# Patient Record
Sex: Female | Born: 1950 | Race: White | Hispanic: No | Marital: Married | Smoking: Never smoker
Health system: Southern US, Community
[De-identification: ages and names within clinical notes are randomized; demographics above are authoritative.]

## PROBLEM LIST (undated history)

## (undated) DIAGNOSIS — M8580 Other specified disorders of bone density and structure, unspecified site: Secondary | ICD-10-CM

## (undated) DIAGNOSIS — F32A Depression, unspecified: Secondary | ICD-10-CM

## (undated) DIAGNOSIS — F329 Major depressive disorder, single episode, unspecified: Secondary | ICD-10-CM

## (undated) DIAGNOSIS — R251 Tremor, unspecified: Secondary | ICD-10-CM

## (undated) DIAGNOSIS — I639 Cerebral infarction, unspecified: Secondary | ICD-10-CM

## (undated) DIAGNOSIS — E079 Disorder of thyroid, unspecified: Secondary | ICD-10-CM

## (undated) DIAGNOSIS — I10 Essential (primary) hypertension: Secondary | ICD-10-CM

## (undated) DIAGNOSIS — N289 Disorder of kidney and ureter, unspecified: Secondary | ICD-10-CM

## (undated) DIAGNOSIS — R911 Solitary pulmonary nodule: Secondary | ICD-10-CM

## (undated) DIAGNOSIS — E785 Hyperlipidemia, unspecified: Secondary | ICD-10-CM

## (undated) DIAGNOSIS — R609 Edema, unspecified: Secondary | ICD-10-CM

## (undated) DIAGNOSIS — N890 Mild vaginal dysplasia: Secondary | ICD-10-CM

## (undated) DIAGNOSIS — G459 Transient cerebral ischemic attack, unspecified: Secondary | ICD-10-CM

## (undated) DIAGNOSIS — T783XXA Angioneurotic edema, initial encounter: Secondary | ICD-10-CM

## (undated) DIAGNOSIS — G4730 Sleep apnea, unspecified: Secondary | ICD-10-CM

## (undated) HISTORY — DX: Tremor, unspecified: R25.1

## (undated) HISTORY — DX: Cerebral infarction, unspecified: I63.9

## (undated) HISTORY — DX: Hyperlipidemia, unspecified: E78.5

## (undated) HISTORY — DX: Transient cerebral ischemic attack, unspecified: G45.9

## (undated) HISTORY — DX: Angioneurotic edema, initial encounter: T78.3XXA

## (undated) HISTORY — DX: Essential (primary) hypertension: I10

## (undated) HISTORY — DX: Mild vaginal dysplasia: N89.0

## (undated) HISTORY — PX: HERNIA REPAIR: SHX51

## (undated) HISTORY — DX: Depression, unspecified: F32.A

## (undated) HISTORY — DX: Disorder of thyroid, unspecified: E07.9

## (undated) HISTORY — PX: OTHER SURGICAL HISTORY: SHX169

## (undated) HISTORY — DX: Major depressive disorder, single episode, unspecified: F32.9

## (undated) HISTORY — DX: Sleep apnea, unspecified: G47.30

## (undated) HISTORY — DX: Edema, unspecified: R60.9

## (undated) HISTORY — DX: Other specified disorders of bone density and structure, unspecified site: M85.80

## (undated) HISTORY — DX: Solitary pulmonary nodule: R91.1

## (undated) HISTORY — DX: Disorder of kidney and ureter, unspecified: N28.9

## (undated) HISTORY — PX: BROW LIFT: SHX178

---

## 1998-10-14 ENCOUNTER — Other Ambulatory Visit: Admission: RE | Admit: 1998-10-14 | Discharge: 1998-10-14 | Payer: Self-pay | Admitting: Obstetrics and Gynecology

## 1998-11-04 ENCOUNTER — Encounter: Payer: Self-pay | Admitting: Obstetrics and Gynecology

## 1998-11-07 HISTORY — PX: ABDOMINAL HYSTERECTOMY: SHX81

## 1998-11-09 ENCOUNTER — Inpatient Hospital Stay (HOSPITAL_COMMUNITY): Admission: RE | Admit: 1998-11-09 | Discharge: 1998-11-12 | Payer: Self-pay | Admitting: Obstetrics and Gynecology

## 1999-11-09 ENCOUNTER — Other Ambulatory Visit: Admission: RE | Admit: 1999-11-09 | Discharge: 1999-11-09 | Payer: Self-pay | Admitting: Obstetrics and Gynecology

## 2000-08-09 ENCOUNTER — Encounter: Payer: Self-pay | Admitting: *Deleted

## 2000-08-09 ENCOUNTER — Inpatient Hospital Stay (HOSPITAL_COMMUNITY): Admission: EM | Admit: 2000-08-09 | Discharge: 2000-08-10 | Payer: Self-pay | Admitting: Emergency Medicine

## 2000-09-08 ENCOUNTER — Ambulatory Visit (HOSPITAL_COMMUNITY): Admission: RE | Admit: 2000-09-08 | Discharge: 2000-09-08 | Payer: Self-pay | Admitting: Neurology

## 2000-09-11 ENCOUNTER — Encounter: Admission: RE | Admit: 2000-09-11 | Discharge: 2000-12-10 | Payer: Self-pay | Admitting: Neurology

## 2001-06-15 ENCOUNTER — Other Ambulatory Visit: Admission: RE | Admit: 2001-06-15 | Discharge: 2001-06-15 | Payer: Self-pay | Admitting: Obstetrics and Gynecology

## 2001-10-09 ENCOUNTER — Encounter: Payer: Self-pay | Admitting: Emergency Medicine

## 2001-10-09 ENCOUNTER — Emergency Department (HOSPITAL_COMMUNITY): Admission: EM | Admit: 2001-10-09 | Discharge: 2001-10-09 | Payer: Self-pay

## 2002-04-12 ENCOUNTER — Ambulatory Visit (HOSPITAL_COMMUNITY): Admission: RE | Admit: 2002-04-12 | Discharge: 2002-04-12 | Payer: Self-pay | Admitting: Interventional Cardiology

## 2002-04-12 ENCOUNTER — Encounter: Payer: Self-pay | Admitting: Interventional Cardiology

## 2002-06-28 ENCOUNTER — Other Ambulatory Visit: Admission: RE | Admit: 2002-06-28 | Discharge: 2002-06-28 | Payer: Self-pay | Admitting: Obstetrics and Gynecology

## 2002-09-27 ENCOUNTER — Ambulatory Visit (HOSPITAL_COMMUNITY): Admission: RE | Admit: 2002-09-27 | Discharge: 2002-09-27 | Payer: Self-pay | Admitting: Gastroenterology

## 2003-01-14 ENCOUNTER — Encounter: Payer: Self-pay | Admitting: Pulmonary Disease

## 2003-01-14 ENCOUNTER — Ambulatory Visit (HOSPITAL_COMMUNITY): Admission: RE | Admit: 2003-01-14 | Discharge: 2003-01-15 | Payer: Self-pay | Admitting: Pulmonary Disease

## 2003-05-07 ENCOUNTER — Inpatient Hospital Stay (HOSPITAL_COMMUNITY): Admission: RE | Admit: 2003-05-07 | Discharge: 2003-05-12 | Payer: Self-pay | Admitting: Orthopedic Surgery

## 2003-05-11 ENCOUNTER — Encounter: Payer: Self-pay | Admitting: Orthopedic Surgery

## 2003-06-25 ENCOUNTER — Ambulatory Visit (HOSPITAL_COMMUNITY): Admission: RE | Admit: 2003-06-25 | Discharge: 2003-06-25 | Payer: Self-pay | Admitting: Orthopedic Surgery

## 2003-07-11 ENCOUNTER — Other Ambulatory Visit: Admission: RE | Admit: 2003-07-11 | Discharge: 2003-07-11 | Payer: Self-pay | Admitting: Obstetrics and Gynecology

## 2003-08-08 ENCOUNTER — Ambulatory Visit (HOSPITAL_BASED_OUTPATIENT_CLINIC_OR_DEPARTMENT_OTHER): Admission: RE | Admit: 2003-08-08 | Discharge: 2003-08-08 | Payer: Self-pay | Admitting: Neurology

## 2003-12-31 ENCOUNTER — Emergency Department (HOSPITAL_COMMUNITY): Admission: EM | Admit: 2003-12-31 | Discharge: 2003-12-31 | Payer: Self-pay | Admitting: Family Medicine

## 2004-02-26 ENCOUNTER — Ambulatory Visit (HOSPITAL_COMMUNITY): Admission: RE | Admit: 2004-02-26 | Discharge: 2004-02-26 | Payer: Self-pay | Admitting: Cardiology

## 2004-09-28 ENCOUNTER — Ambulatory Visit: Payer: Self-pay | Admitting: Cardiology

## 2005-03-08 ENCOUNTER — Ambulatory Visit (HOSPITAL_COMMUNITY): Admission: RE | Admit: 2005-03-08 | Discharge: 2005-03-08 | Payer: Self-pay

## 2005-05-02 ENCOUNTER — Ambulatory Visit: Payer: Self-pay | Admitting: Cardiology

## 2005-12-26 ENCOUNTER — Ambulatory Visit (HOSPITAL_COMMUNITY): Admission: RE | Admit: 2005-12-26 | Discharge: 2005-12-26 | Payer: Self-pay | Admitting: Internal Medicine

## 2006-01-05 ENCOUNTER — Ambulatory Visit (HOSPITAL_COMMUNITY): Admission: RE | Admit: 2006-01-05 | Discharge: 2006-01-05 | Payer: Self-pay | Admitting: Internal Medicine

## 2006-06-22 ENCOUNTER — Ambulatory Visit: Payer: Self-pay | Admitting: Cardiology

## 2006-08-04 ENCOUNTER — Ambulatory Visit (HOSPITAL_COMMUNITY): Admission: RE | Admit: 2006-08-04 | Discharge: 2006-08-04 | Payer: Self-pay | Admitting: Internal Medicine

## 2006-12-13 ENCOUNTER — Other Ambulatory Visit: Admission: RE | Admit: 2006-12-13 | Discharge: 2006-12-13 | Payer: Self-pay | Admitting: Gynecology

## 2007-02-07 ENCOUNTER — Ambulatory Visit (HOSPITAL_COMMUNITY): Admission: RE | Admit: 2007-02-07 | Discharge: 2007-02-07 | Payer: Self-pay | Admitting: Internal Medicine

## 2007-04-08 ENCOUNTER — Emergency Department (HOSPITAL_COMMUNITY): Admission: EM | Admit: 2007-04-08 | Discharge: 2007-04-08 | Payer: Self-pay | Admitting: Emergency Medicine

## 2007-08-10 ENCOUNTER — Other Ambulatory Visit: Admission: RE | Admit: 2007-08-10 | Discharge: 2007-08-10 | Payer: Self-pay | Admitting: Gynecology

## 2007-08-28 ENCOUNTER — Ambulatory Visit: Payer: Self-pay | Admitting: Cardiology

## 2007-11-08 DIAGNOSIS — N890 Mild vaginal dysplasia: Secondary | ICD-10-CM

## 2007-11-08 HISTORY — DX: Mild vaginal dysplasia: N89.0

## 2007-11-21 ENCOUNTER — Encounter: Payer: Self-pay | Admitting: Internal Medicine

## 2007-12-04 ENCOUNTER — Ambulatory Visit: Payer: Self-pay | Admitting: Internal Medicine

## 2007-12-04 DIAGNOSIS — R059 Cough, unspecified: Secondary | ICD-10-CM | POA: Insufficient documentation

## 2007-12-04 DIAGNOSIS — J984 Other disorders of lung: Secondary | ICD-10-CM | POA: Insufficient documentation

## 2007-12-04 DIAGNOSIS — R05 Cough: Secondary | ICD-10-CM

## 2007-12-08 ENCOUNTER — Encounter: Payer: Self-pay | Admitting: Cardiology

## 2007-12-08 DIAGNOSIS — F32A Depression, unspecified: Secondary | ICD-10-CM | POA: Insufficient documentation

## 2007-12-08 DIAGNOSIS — F329 Major depressive disorder, single episode, unspecified: Secondary | ICD-10-CM

## 2007-12-08 DIAGNOSIS — I10 Essential (primary) hypertension: Secondary | ICD-10-CM | POA: Insufficient documentation

## 2007-12-08 DIAGNOSIS — G4730 Sleep apnea, unspecified: Secondary | ICD-10-CM | POA: Insufficient documentation

## 2007-12-08 DIAGNOSIS — F419 Anxiety disorder, unspecified: Secondary | ICD-10-CM | POA: Insufficient documentation

## 2007-12-20 ENCOUNTER — Other Ambulatory Visit: Admission: RE | Admit: 2007-12-20 | Discharge: 2007-12-20 | Payer: Self-pay | Admitting: Obstetrics and Gynecology

## 2007-12-25 ENCOUNTER — Ambulatory Visit: Payer: Self-pay | Admitting: Internal Medicine

## 2007-12-25 DIAGNOSIS — R0602 Shortness of breath: Secondary | ICD-10-CM | POA: Insufficient documentation

## 2008-01-30 ENCOUNTER — Ambulatory Visit: Payer: Self-pay | Admitting: Cardiology

## 2008-03-04 ENCOUNTER — Encounter (HOSPITAL_COMMUNITY): Admission: RE | Admit: 2008-03-04 | Discharge: 2008-03-07 | Payer: Self-pay | Admitting: Internal Medicine

## 2008-08-10 ENCOUNTER — Emergency Department (HOSPITAL_COMMUNITY): Admission: EM | Admit: 2008-08-10 | Discharge: 2008-08-10 | Payer: Self-pay | Admitting: Emergency Medicine

## 2008-08-11 ENCOUNTER — Ambulatory Visit: Payer: Self-pay | Admitting: Cardiology

## 2008-08-12 ENCOUNTER — Inpatient Hospital Stay (HOSPITAL_COMMUNITY): Admission: EM | Admit: 2008-08-12 | Discharge: 2008-08-18 | Payer: Self-pay | Admitting: Emergency Medicine

## 2008-08-12 ENCOUNTER — Ambulatory Visit: Payer: Self-pay | Admitting: Cardiology

## 2008-08-13 ENCOUNTER — Encounter (INDEPENDENT_AMBULATORY_CARE_PROVIDER_SITE_OTHER): Payer: Self-pay | Admitting: Internal Medicine

## 2008-08-13 ENCOUNTER — Ambulatory Visit: Payer: Self-pay | Admitting: Vascular Surgery

## 2008-09-17 ENCOUNTER — Encounter: Admission: RE | Admit: 2008-09-17 | Discharge: 2008-11-06 | Payer: Self-pay | Admitting: Nurse Practitioner

## 2008-11-12 ENCOUNTER — Ambulatory Visit: Payer: Self-pay | Admitting: Cardiology

## 2008-11-12 DIAGNOSIS — E7800 Pure hypercholesterolemia, unspecified: Secondary | ICD-10-CM | POA: Insufficient documentation

## 2008-11-12 DIAGNOSIS — R259 Unspecified abnormal involuntary movements: Secondary | ICD-10-CM | POA: Insufficient documentation

## 2008-11-20 ENCOUNTER — Emergency Department (HOSPITAL_COMMUNITY): Admission: EM | Admit: 2008-11-20 | Discharge: 2008-11-20 | Payer: Self-pay | Admitting: Emergency Medicine

## 2009-02-16 ENCOUNTER — Ambulatory Visit: Payer: Self-pay | Admitting: Women's Health

## 2009-02-16 ENCOUNTER — Encounter: Payer: Self-pay | Admitting: Women's Health

## 2009-02-16 ENCOUNTER — Other Ambulatory Visit: Admission: RE | Admit: 2009-02-16 | Discharge: 2009-02-16 | Payer: Self-pay | Admitting: Gynecology

## 2009-03-10 ENCOUNTER — Inpatient Hospital Stay (HOSPITAL_COMMUNITY): Admission: EM | Admit: 2009-03-10 | Discharge: 2009-03-14 | Payer: Self-pay | Admitting: Emergency Medicine

## 2009-03-11 ENCOUNTER — Encounter: Payer: Self-pay | Admitting: Cardiology

## 2009-03-27 ENCOUNTER — Emergency Department (HOSPITAL_COMMUNITY): Admission: EM | Admit: 2009-03-27 | Discharge: 2009-03-27 | Payer: Self-pay | Admitting: Emergency Medicine

## 2009-04-22 ENCOUNTER — Encounter: Payer: Self-pay | Admitting: Cardiology

## 2009-04-22 ENCOUNTER — Ambulatory Visit: Payer: Self-pay | Admitting: Cardiology

## 2009-04-29 ENCOUNTER — Encounter (INDEPENDENT_AMBULATORY_CARE_PROVIDER_SITE_OTHER): Payer: Self-pay | Admitting: *Deleted

## 2009-06-11 ENCOUNTER — Telehealth: Payer: Self-pay | Admitting: Cardiology

## 2009-07-08 ENCOUNTER — Encounter: Payer: Self-pay | Admitting: Cardiology

## 2009-07-08 ENCOUNTER — Ambulatory Visit: Payer: Self-pay | Admitting: Cardiology

## 2009-07-31 ENCOUNTER — Encounter: Payer: Self-pay | Admitting: Cardiology

## 2009-09-17 ENCOUNTER — Telehealth: Payer: Self-pay | Admitting: Cardiology

## 2009-09-21 ENCOUNTER — Encounter: Payer: Self-pay | Admitting: Cardiology

## 2009-10-25 ENCOUNTER — Emergency Department (HOSPITAL_COMMUNITY): Admission: EM | Admit: 2009-10-25 | Discharge: 2009-10-25 | Payer: Self-pay | Admitting: Emergency Medicine

## 2009-10-27 ENCOUNTER — Encounter: Payer: Self-pay | Admitting: Cardiology

## 2009-11-04 ENCOUNTER — Encounter: Payer: Self-pay | Admitting: Cardiology

## 2009-11-09 ENCOUNTER — Telehealth: Payer: Self-pay | Admitting: Cardiology

## 2009-11-10 ENCOUNTER — Encounter: Payer: Self-pay | Admitting: Cardiology

## 2009-11-25 ENCOUNTER — Encounter: Payer: Self-pay | Admitting: Cardiology

## 2009-12-01 ENCOUNTER — Telehealth: Payer: Self-pay | Admitting: Cardiology

## 2009-12-18 ENCOUNTER — Encounter: Payer: Self-pay | Admitting: Cardiology

## 2009-12-25 ENCOUNTER — Encounter: Payer: Self-pay | Admitting: Cardiology

## 2009-12-29 ENCOUNTER — Encounter: Payer: Self-pay | Admitting: Cardiology

## 2010-01-06 ENCOUNTER — Ambulatory Visit: Payer: Self-pay | Admitting: Cardiology

## 2010-01-06 ENCOUNTER — Encounter: Payer: Self-pay | Admitting: Cardiology

## 2010-01-24 ENCOUNTER — Encounter: Payer: Self-pay | Admitting: Cardiology

## 2010-01-25 ENCOUNTER — Encounter: Payer: Self-pay | Admitting: Cardiology

## 2010-01-31 ENCOUNTER — Encounter: Payer: Self-pay | Admitting: Cardiology

## 2010-02-01 ENCOUNTER — Encounter: Payer: Self-pay | Admitting: Cardiology

## 2010-02-05 ENCOUNTER — Encounter: Payer: Self-pay | Admitting: Cardiology

## 2010-02-11 ENCOUNTER — Ambulatory Visit: Payer: Self-pay | Admitting: Cardiology

## 2010-02-18 ENCOUNTER — Other Ambulatory Visit: Admission: RE | Admit: 2010-02-18 | Discharge: 2010-02-18 | Payer: Self-pay | Admitting: Obstetrics and Gynecology

## 2010-02-18 ENCOUNTER — Ambulatory Visit: Payer: Self-pay | Admitting: Women's Health

## 2010-02-23 ENCOUNTER — Encounter: Admission: RE | Admit: 2010-02-23 | Discharge: 2010-02-23 | Payer: Self-pay | Admitting: Internal Medicine

## 2010-02-23 ENCOUNTER — Encounter: Payer: Self-pay | Admitting: Cardiology

## 2010-02-24 ENCOUNTER — Encounter: Payer: Self-pay | Admitting: Cardiology

## 2010-03-03 ENCOUNTER — Telehealth: Payer: Self-pay | Admitting: Cardiology

## 2010-06-02 ENCOUNTER — Encounter: Payer: Self-pay | Admitting: Cardiology

## 2010-06-02 ENCOUNTER — Ambulatory Visit: Payer: Self-pay | Admitting: Cardiology

## 2010-06-02 ENCOUNTER — Telehealth (INDEPENDENT_AMBULATORY_CARE_PROVIDER_SITE_OTHER): Payer: Self-pay | Admitting: *Deleted

## 2010-06-07 DIAGNOSIS — E876 Hypokalemia: Secondary | ICD-10-CM | POA: Insufficient documentation

## 2010-06-10 ENCOUNTER — Ambulatory Visit: Payer: Self-pay | Admitting: Internal Medicine

## 2010-06-10 ENCOUNTER — Ambulatory Visit (HOSPITAL_COMMUNITY): Admission: RE | Admit: 2010-06-10 | Discharge: 2010-06-10 | Payer: Self-pay | Admitting: Cardiology

## 2010-06-10 ENCOUNTER — Ambulatory Visit: Payer: Self-pay

## 2010-06-10 ENCOUNTER — Encounter: Payer: Self-pay | Admitting: Cardiology

## 2010-08-30 ENCOUNTER — Encounter: Admission: RE | Admit: 2010-08-30 | Discharge: 2010-08-30 | Payer: Self-pay | Admitting: Neurosurgery

## 2010-11-07 HISTORY — PX: WRIST FRACTURE SURGERY: SHX121

## 2010-11-28 ENCOUNTER — Encounter: Payer: Self-pay | Admitting: Cardiology

## 2010-11-28 ENCOUNTER — Encounter: Payer: Self-pay | Admitting: Internal Medicine

## 2010-12-07 NOTE — Progress Notes (Signed)
Summary: REFILL  Phone Note Refill Request   Refills Requested: Medication #1:  DIOVAN HCT 320-25 MG TABS ONE TABLET BY MOUTH ONCE DAILY   Supply Requested: [facility] months [facility] [address] [id number]   Method Requested: Fax to Local Pharmacy Initial call taken by: Migdalia Dk,  November 09, 2009 2:17 PM    Prescriptions: DIOVAN HCT 320-25 MG TABS (VALSARTAN-HYDROCHLOROTHIAZIDE) ONE TABLET BY MOUTH ONCE DAILY  #30 x 12   Entered by:   Kem Parkinson   Authorized by:   Ferman Hamming, MD, [facility]   Signed by:   Kem Parkinson on 11/09/2009   Method used:   [facility]        [facility] [phone][facility] (retail)       [address] [place], [place]  [address]       Ph: [phone] or [phone]       Fax: [phone]   RxID:   [id number]

## 2010-12-07 NOTE — Assessment & Plan Note (Signed)
Summary: per pt call she has been having trouble with b/p   Referring Provider:  Marisue Brooklyn Primary Provider:  Marisue Brooklyn  CC:  sob and and increasing BP.  History of Present Illness: Ms. Knarr is a pleasant female who I have seen for severe hypertension. Patient had a Myoview performed in October of 2005 that showed an ejection fraction of 75% and no ischemia. Echocardiogram in October, 2009 revealed normal LV function. She had elevated catecholamines previously but a negative MIBG scan for pheochromocytoma. She did have an MRA 03-11-09 that showed no renal artery stenosis. A chest CT 03-11-09  showed no dissection or pulmonary embolus but there was a small nodule and followup was recommended in one year.  Note previous TSH was normal. I last saw her in March of 2011. Since then she does have some dyspnea on exertion relieved with rest. It is not associated with chest pain and is more chronic. There is no orthopnea or PND but there is mild pedal edema. She does not have exertional chest pain. She has had problems with elevated blood pressure since I saw her previously. Her medications have been adjusted with the addition of Benicar and minoxidil. She states that her blood pressure still runs in the 170/100 range in the afternoons. Also note that her labetalol was decreased secondary to some fatigue.  Current Medications (verified): 1)  Alprazolam 0.5 Mg  Tabs (Alprazolam) .... As Needed 2)  Ambien Cr 12.5 Mg  Tbcr (Zolpidem Tartrate) .... As Needed 3)  Symbicort 160-4.5 Mcg/act Aero (Budesonide-Formoterol Fumarate) .... As Needed 4)  Protonix 40 Mg Tbec (Pantoprazole Sodium) .... Take 1 Tablet By Mouth Two Times A Day 5)  Plavix 75 Mg Tabs (Clopidogrel Bisulfate) .... Take One Tablet By Mouth Daily 6)  Simvastatin 40 Mg Tabs (Simvastatin) .... Take One Tablet By Mouth Daily At Bedtime 7)  Labetalol Hcl 100 Mg Tabs (Labetalol Hcl) .Marland Kitchen.. 1 Tab Morning and 2 Tabs Qhs 8)  Aspirin 81 Mg Tbec  (Aspirin) .... Take One Tablet By Mouth Daily 9)  Furosemide 20 Mg Tabs (Furosemide) .Marland Kitchen.. 1 Tan Every Other Day 10)  Clonidine Hcl 0.2 Mg Tabs (Clonidine Hcl) .... Take One Tablet By Two Times A Day 11)  Wellbutrin Xl 150 Mg Xr24h-Tab (Bupropion Hcl) .... Take 1 Tablet By Mouth Two Times A Day 12)  Qvar 40 Mcg/act Aers (Beclomethasone Dipropionate) .... 2 Puffs Two Times A Day 13)  Benicar 20 Mg Tabs (Olmesartan Medoxomil) .... Take One Tablet By Mouth Daily 14)  Minoxidil 10 Mg Tabs (Minoxidil) .... 1/2 Tab By Mouth Once Daily 15)  Study Drug Insulin Resistant .... As Directed  Allergies: 1)  ! Demerol 2)  ! Ace Inhibitors  Past History:  Past Medical History: Reviewed history from 01/06/2010 and no changes required. SLEEP APNEA (ICD-780.57) HYPERTENSION (ICD-401.9) DEPRESSION (ICD-311) PULMONARY NODULE (ICD-518.89) CVA  3. Tremor, of unclear etiology.   4. Hx of sleep apnea. x has cpap machine x non compliant due to "panic". Managed by Dr. Jobe Marker in [place], [place] 4. Pheo workup and thyroid workup negative in past NOS  5. Post viral reactive cough periodically since 2002 6. s/p "mini stroke" Hyperlipidemia Mild renal insufficiency  Past Surgical History: Reviewed history from 01/06/2010 and no changes required. Left knee replacement.  Hysterectomy.  Stamey procedure. .  Status post lumpectomy.  Bilateral tubal ligation.   Social History: Reviewed history from 11/12/2008 and no changes required. The patient is a nonsmoker. She is not involved in  an exercise program and she is post menopausal. 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 Passive smoker 1st 20 years of life.  Married  Alcohol Use - yes (rare)  Review of Systems       Problems with bilateral lower extremity pain, occasional headaches but no fevers or chills, productive cough, hemoptysis, dysphasia, odynophagia, melena, hematochezia, dysuria, hematuria, rash, seizure activity, orthopnea, PND,   claudication. Remaining systems are negative.   Vital Signs:  Patient profile:   60 year old female Height:      62 inches Weight:      196 pounds BMI:     35.98 Pulse rate:   77 / minute Resp:     14 per minute BP sitting:   141 / 81  (left arm)  Vitals Entered By: Kem Parkinson (February 11, 2010 4:37 PM)  Physical Exam  General:  Well-developed well-nourished in no acute distress.  Skin is warm and dry.  HEENT is normal.  Neck is supple. No thyromegaly.  Chest is clear to auscultation with normal expansion.  Cardiovascular exam is regular rate and rhythm.  Abdominal exam nontender or distended. No masses palpated. Extremities show trace edema. neuro grossly intact; resting tremor    Impression & Recommendations:  Problem # 1:  HYPERTENSION (ICD-401.9) Pt's blood pressure remains high in the evenings by her report. I will increase her Benicar to 40 mg p.o. daily. Note minoxidil was also recently added and she has developed increasing pedal edema. I will increase her Lasix to 20 mg p.o. daily. We will check a bmet in one week. Her updated medication list for this problem includes:    Labetalol Hcl 100 Mg Tabs (Labetalol hcl) .Marland Kitchen... 1 tab morning and 2 tabs qhs    Aspirin 81 Mg Tbec (Aspirin) .Marland Kitchen... Take one tablet by mouth daily    Furosemide 20 Mg Tabs (Furosemide) .Marland Kitchen... Take one tablet by mouth daily    Clonidine Hcl 0.2 Mg Tabs (Clonidine hcl) .Marland Kitchen... Take one tablet by two times a day    Benicar 40 Mg Tabs (Olmesartan medoxomil) .Marland Kitchen... Take one tablet by mouth daily    Minoxidil 10 Mg Tabs (Minoxidil) .Marland Kitchen... 1/2 tab by mouth once daily  Problem # 2:  HYPERCHOLESTEROLEMIA-PURE (ICD-272.0) Continue statin. Management per primary care. Her updated medication list for this problem includes:    Simvastatin 40 Mg Tabs (Simvastatin) .Marland Kitchen... Take one tablet by mouth daily at bedtime  Problem # 3:  SLEEP APNEA (ICD-780.57)  Problem # 4:  PULMONARY NODULE (ZOX-096.04) Patient  scheduled for followup chest CT in May.  Patient Instructions: 1)  Your physician recommends that you schedule a follow-up appointment in: 3 months. 2)  Your physician recommends that you return for lab work in: 1 week BMET. 3)  Your physician has recommended you make the following change in your medication: Furosemide 20 mg take one tablet by mouth daily , Benicar 40 mg one tablet daily. Prescriptions: BENICAR 40 MG TABS (OLMESARTAN MEDOXOMIL) Take one tablet by mouth daily  #30 x 6   Entered by:   Ollen Gross, RN, BSN   Authorized by:   Ferman Hamming, MD, [facility]   Signed by:   Ollen Gross, RN, BSN on 02/11/2010   Method used:   Electronically to        [facility] [phone]* (retail)       [address] [place], [place]  [address]  Ph: [phone] or [phone]       Fax: [phone]   RxID:   [id number] FUROSEMIDE 20 MG TABS (FUROSEMIDE) take one tablet by mouth daily  #30 x 6   Entered by:   Ollen Gross, RN, BSN   Authorized by:   Ferman Hamming, MD, [facility]   Signed by:   Ollen Gross, RN, BSN on 02/11/2010   Method used:   Electronically to        [facility] [phone]* (retail)       [address] [place], [place]  [address]       Ph: [phone] or [phone]       Fax: [phone]   RxID:   [id number]

## 2010-12-07 NOTE — Letter (Signed)
[facility]   [facility] Office Note   Imported By: Roderic Ovens 04/27/2010 15:42:38  _____________________________________________________________________  External Attachment:    Type:   Image     Comment:   External Document

## 2010-12-07 NOTE — Progress Notes (Signed)
Summary: [facility] Office Note   [facility] Office Note   Imported By: Roderic Ovens 04/27/2010 15:26:58  _____________________________________________________________________  External Attachment:    Type:   Image     Comment:   External Document

## 2010-12-07 NOTE — Progress Notes (Signed)
Summary: [facility] Office Note   [facility] Office Note   Imported By: Roderic Ovens 04/27/2010 15:27:21  _____________________________________________________________________  External Attachment:    Type:   Image     Comment:   External Document

## 2010-12-07 NOTE — Assessment & Plan Note (Signed)
Summary: [facility]   Visit Type:  6 months follow up Referring Provider:  Marisue Brooklyn Primary Provider:  Marisue Brooklyn  CC:  Face edema and sometimes on legs.  History of Present Illness: [name] [name] is a pleasant female who I have seen for severe hypertension. Patient had a Myoview performed in October of 2005 that showed an ejection fraction of 75% and no ischemia. Echocardiogram in October, 2009 revealed normal LV function. She had elevated catecholamines previously but a negative MIBG scan for pheochromocytoma. She did have an MRA 03-11-09 that showed no renal artery stenosis. A chest CT 03-11-09  showed no dissection or pulmonary embolus but there was a small nodule and followup was recommended in one year.  Note previous TSH was normal. I last saw her in Sept of 2010. Since then she does have some dyspnea on exertion relieved with rest. There is no associated chest pain. This is a chronic issue. There is no orthopnea, PND or pedal edema. She has had several episodes of a rash around her neck, face and some tongue swelling. This has been felt to be an allergic reaction and possibly angioedema. Her Diovan was discontinued but she had an episode subsequently and has been on steroids. She has not had exertional chest pain.  Current Medications (verified): 1)  Alprazolam 0.5 Mg  Tabs (Alprazolam) .... As Needed 2)  Ambien Cr 12.5 Mg  Tbcr (Zolpidem Tartrate) .... As Needed 3)  Albuterol 90 Mcg/act  Aers (Albuterol) .... As Needed 4)  Protonix 40 Mg Tbec (Pantoprazole Sodium) .... Take 1 Tablet By Mouth Two Times A Day 5)  Plavix 75 Mg Tabs (Clopidogrel Bisulfate) .... Take One Tablet By Mouth Daily 6)  Levsin 0.125 Mg Tabs (Hyoscyamine Sulfate) .... As Needed 7)  Simvastatin 40 Mg Tabs (Simvastatin) .... Take One Tablet By Mouth Daily At Bedtime 8)  Soma 250 Mg Tabs (Carisoprodol) .... As Needed 9)  Labetalol Hcl 200 Mg Tabs (Labetalol Hcl) .... Take 1 Tablet By Mouth Two Times A Day 10)   Aspirin 81 Mg Tbec (Aspirin) .... Take One Tablet By Mouth Daily 11)  Lasix 40 Mg Tabs (Furosemide) .... As Needed 12)  Ultram 50 Mg Tabs (Tramadol Hcl) .... As Needed 13)  Clonidine Hcl 0.2 Mg Tabs (Clonidine Hcl) .... Take One Tablet By Mouth Three Times A Day 14)  Wellbutrin Xl 150 Mg Xr24h-Tab (Bupropion Hcl) .... Take 1 Tablet By Mouth Once A Day 15)  Qvar 40 Mcg/act Aers (Beclomethasone Dipropionate) .... 2 Puffs Two Times A Day  Allergies: 1)  ! Demerol 2)  ! Ace Inhibitors  Past History:  Past Medical History: SLEEP APNEA (ICD-780.57) HYPERTENSION (ICD-401.9) DEPRESSION (ICD-311) PULMONARY NODULE (ICD-518.89) CVA  3. Tremor, of unclear etiology.   4. Hx of sleep apnea. x has cpap machine x non compliant due to "panic". Managed by Dr. Jobe Marker in [place], [place] 4. Pheo workup and thyroid workup negative in past NOS  5. Post viral reactive cough periodically since 2002 6. s/p "mini stroke" Hyperlipidemia Mild renal insufficiency  Past Surgical History: Left knee replacement.  Hysterectomy.  Stamey procedure. .  Status post lumpectomy.  Bilateral tubal ligation.   Social History: Reviewed history from 11/12/2008 and no changes required. The patient is a nonsmoker. She is not involved in an exercise program and she is post menopausal. 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 Passive smoker 1st 20 years of life.  Married  Alcohol Use - yes (rare)  Review  of Systems       She has had a productive cough but no fevers or chills, hemoptysis, dysphasia, odynophagia, melena, hematochezia, dysuria, hematuria, rash, seizure activity, orthopnea, PND, pedal edema, claudication. Remaining systems are negative.   Vital Signs:  Patient profile:   60 year old female Height:      62 inches Weight:      192.75 pounds BMI:     35.38 Pulse rate:   60 / minute Pulse rhythm:   regular Resp:     18 per minute BP sitting:   142 / 84  (left arm) Cuff size:    large  Vitals Entered By: Vikki Ports (January 06, 2010 2:09 PM)  Physical Exam  General:  Well-developed well-nourished in no acute distress.  Skin is warm and dry. Mild erythema around her neck and mouth. HEENT is normal.  Neck is supple. No thyromegaly.  Chest is clear to auscultation with normal expansion.  Cardiovascular exam is regular rate and rhythm.  Abdominal exam nontender or distended. No masses palpated. Extremities show no edema. neuro grossly intact    EKG  Procedure date:  01/06/2010  Findings:      Sinus rhythm at a rate of 60. Axis normal. Nonspecific T-wave changes.  Impression & Recommendations:  Problem # 1:  HYPERTENSION (ICD-401.9) The patient's Diovan HCT has been discontinued and clonidine added. There is concern that the Diovan caused angioedema. However she did have an episode after the Diovan was discontinued. We will follow this. Once it is clear that the Diovan is not contributing we may resume this in the future. However for now her blood pressure is reasonable on labetalol and clonidine. The following medications were removed from the medication list:    Diovan Hct 320-25 Mg Tabs (Valsartan-hydrochlorothiazide) ..... One tablet by mouth once daily Her updated medication list for this problem includes:    Labetalol Hcl 200 Mg Tabs (Labetalol hcl) .Marland Kitchen... Take 1 tablet by mouth two times a day    Aspirin 81 Mg Tbec (Aspirin) .Marland Kitchen... Take one tablet by mouth daily    Lasix 40 Mg Tabs (Furosemide) .Marland Kitchen... As needed    Clonidine Hcl 0.2 Mg Tabs (Clonidine hcl) .Marland Kitchen... Take one tablet by mouth three times a day  Problem # 2:  HYPERCHOLESTEROLEMIA-PURE (ICD-272.0)  Lipids and liver followed by primary care. Her updated medication list for this problem includes:    Simvastatin 40 Mg Tabs (Simvastatin) .Marland Kitchen... Take one tablet by mouth daily at bedtime  Her updated medication list for this problem includes:    Simvastatin 40 Mg Tabs (Simvastatin) .Marland Kitchen... Take one  tablet by mouth daily at bedtime  Problem # 3:  SLEEP APNEA (ICD-780.57)  Problem # 4:  PULMONARY NODULE (ICD-518.89)  She will need a followup noncontrast chest CT in May of 2011.  Orders: CT Scan  (CT Scan)  Problem # 5:  DEPRESSION (ICD-311)  Patient Instructions: 1)  Your physician recommends that you schedule a follow-up appointment in: 6 MONTHS 2)  Non-Cardiac CT scanning, (CAT scanning), is a noninvasive, special x-ray that produces cross-sectional images of the body using x-rays and a computer. CT scans help physicians diagnose and treat medical conditions. For some CT exams, a contrast material is used to enhance visibility in the area of the body being studied. CT scans provide greater clarity and reveal more details than regular x-ray exams.

## 2010-12-07 NOTE — Progress Notes (Signed)
Summary: Pt calling regarding a medication and  Angio Edema  Phone Note Call from Patient Call back at Home Phone [phone]   Caller: Patient Summary of Call: Pt is having issues with angio edema and primary care md has taken pt off Benecar  DR.Amy Elisabeth Most Initial call taken by: Judie Grieve,  March 03, 2010 2:52 PM  Follow-up for Phone Call        Left message to call back Deliah Goody, RN  March 03, 2010 3:56 PM PT RETURNING CALL Judie Grieve  March 03, 2010 4:15 PM Left message to call back Deliah Goody, RN  March 03, 2010 5:28 PM  spoke with pt, she wanted to let us know she had another episode of angioedema and dr Elisabeth Most had stopped her benicar. she is tracking her bp and it is running 170's/90-100's. dr Elisabeth Most is checking some blood work and is going to make decisions about bp meds based on the blood work results. the pt just wanted to let us know what was going on. blood work will be fowarded to [place] and if dr Elisabeth Most needs our help with the bp she will contact us. will let dr Jens Som know Deliah Goody, RN  Mar 09, 2010 8:59 AM

## 2010-12-07 NOTE — Assessment & Plan Note (Signed)
Summary: [facility]   Visit Type:  3 months follow up Referring Provider:  Marisue Brooklyn Primary Provider:  Marisue Brooklyn  CC:  Edema.  History of Present Illness: [name] is a pleasant female who I have seen for severe hypertension. Patient had a Myoview performed in October of 2005 that showed an ejection fraction of 75% and no ischemia. Echocardiogram in October, 2009 revealed normal LV function. She had elevated catecholamines previously but a negative MIBG scan for pheochromocytoma. She did have an MRA 03-11-09 that showed no renal artery stenosis. A chest CT 03-11-09  showed no dissection or pulmonary embolus. Followup chest CT in April, 2011 showed unchanged nodules and followup was not recommended. An MRI of the abdomen in April of 2011 showed no pheochromocytoma. Note previous TSH was normal. I last saw her in April of 2011. Since then she states her blood pressure continues to run high. She also notices some dyspnea on exertion relieved with rest. There is no orthopnea or PND she has had pedal edema. Her labetalol and Lasix were increased last week by her primary care physician. Note she does not have exertional chest pain or syncope.  Current Medications (verified): 1)  Alprazolam 0.5 Mg  Tabs (Alprazolam) .... As Needed 2)  Symbicort 160-4.5 Mcg/act Aero (Budesonide-Formoterol Fumarate) .... As Needed 3)  Protonix 40 Mg Tbec (Pantoprazole Sodium) .... Take 1 Tablet By Mouth Two Times A Day 4)  Plavix 75 Mg Tabs (Clopidogrel Bisulfate) .... Take One Tablet By Mouth Daily 5)  Simvastatin 40 Mg Tabs (Simvastatin) .... Take One Tablet By Mouth Daily At Bedtime 6)  Labetalol Hcl 100 Mg Tabs (Labetalol Hcl) .Marland Kitchen.. 1 1/2  Tab Morning and 2 Tabs At Bedtime 7)  Aspirin 81 Mg Tbec (Aspirin) .... Take One Tablet By Mouth Daily 8)  Furosemide 20 Mg Tabs (Furosemide) .... Take 1 1/2 Tablets Two Times A Day 9)  Clonidine Hcl 0.2 Mg Tabs (Clonidine Hcl) .... Take One Tablet By Two Times A  Day 10)  Wellbutrin Xl 150 Mg Xr24h-Tab (Bupropion Hcl) .... Take 1 Tablet By Mouth Two Times A Day 11)  Qvar 40 Mcg/act Aers (Beclomethasone Dipropionate) .... 2 Puffs Two Times A Day 12)  Minoxidil 10 Mg Tabs (Minoxidil) .... 1/2 Tab By Mouth Once Daily 13)  Study Drug Insulin Resistant .... As Directed 14)  Vitamin D 6000mg  .... Once A Day 15)  Potassium Otc Mg? .... Once A Day 16)  Butrans 5 Mcg/hr Ptwk (Buprenorphine) .... Once A Week  Allergies: 1)  ! Demerol 2)  ! Ace Inhibitors 3)  ! Beta Blockers  Past History:  Past Medical History: HYPERTENSION (ICD-401.9) DEPRESSION (ICD-311) PULMONARY NODULE (ICD-518.89) CVA Tremor, of unclear etiology.  Hx of sleep apnea. x has cpap machine x non compliant due to "panic". Managed by Dr. Jobe Marker in  post viral reactive cough periodically since 2002 s/p "mini stroke" Hyperlipidemia Mild renal insufficiency  Past Surgical History: Left knee replacement.  Hysterectomy.  Stamey procedure. Status post lumpectomy.  Bilateral tubal ligation.   Social History: Reviewed history from 11/12/2008 and no changes required. The patient is a nonsmoker. She is not involved in an exercise program and she is post menopausal. 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 Passive smoker 1st 20 years of life.  Married  Alcohol Use - yes (rare)  Review of Systems       Some back pain but no fevers or chills, productive cough, hemoptysis, dysphasia, odynophagia, melena, hematochezia,  dysuria, hematuria, rash, seizure activity, orthopnea, PND,  claudication. Remaining systems are negative.   Vital Signs:  Patient profile:   60 year old female Height:      62 inches Weight:      193.25 pounds BMI:     35.47 Pulse rate:   74 / minute Pulse rhythm:   regular Resp:     18 per minute BP sitting:   159 / 90  (left arm) Cuff size:   large  Vitals Entered By: Vikki Ports (June 02, 2010 2:33 PM)  Physical Exam  General:   Well-developed well-nourished in no acute distress.  Skin is warm and dry.  HEENT is normal.  Neck is supple. No thyromegaly.  Chest is clear to auscultation with normal expansion.  Cardiovascular exam is regular rate and rhythm.  Abdominal exam nontender or distended. No masses palpated. Extremities show 1+ edema. neuro grossly intact    EKG  Procedure date:  06/02/2010  Findings:      sinus rhythm at a rate of 74. Axis normal. Nonspecific ST changes.  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 Lipids and liver monitored by primary care. Her updated medication list for this problem includes:    Simvastatin 40 Mg Tabs (Simvastatin) .Marland Kitchen... Take one tablet by mouth daily at bedtime  Her updated medication list for this problem includes:    Simvastatin 40 Mg Tabs (Simvastatin) .Marland Kitchen... Take one tablet by mouth daily at bedtime  Problem # 2:  SHORTNESS OF BREATH (SOB) (ICD-786.05)  Plan repeat echocardiogram. This will be to rule out pericardial effusion given minoxidil use and also to reassess LV function. The following medications were removed from the medication list:    Benicar 40 Mg Tabs (Olmesartan medoxomil) .Marland Kitchen... Take one tablet by mouth daily Her updated medication list for this problem includes:    Labetalol Hcl 100 Mg Tabs (Labetalol hcl) .Marland Kitchen... 1 1/2  tab morning and 2 tabs at bedtime    Aspirin 81 Mg Tbec (Aspirin) .Marland Kitchen... Take one tablet by mouth daily    Furosemide 20 Mg Tabs (Furosemide) .Marland Kitchen... Take 1 1/2 tablets two times a day  The following medications were removed from the medication list:    Benicar 40 Mg Tabs (Olmesartan medoxomil) .Marland Kitchen... Take one tablet by mouth daily Her updated medication list for this problem includes:    Labetalol Hcl 100 Mg Tabs (Labetalol hcl) .Marland Kitchen... 1 1/2  tab morning and 2 tabs at bedtime    Aspirin 81 Mg Tbec (Aspirin) .Marland Kitchen... Take one tablet by mouth daily    Furosemide 20 Mg Tabs  (Furosemide) .Marland Kitchen... Take 1 1/2 tablets two times a day  Orders: Echocardiogram (Echo)  Problem # 3:  HYPERTENSION (ICD-401.9) Blood pressure elevated. Increase labetalol to 500 mg p.o. b.i.d. Check potassium and renal function. The following medications were removed from the medication list:    Benicar 40 Mg Tabs (Olmesartan medoxomil) .Marland Kitchen... Take one tablet by mouth daily Her updated medication list for this problem includes:    Labetalol Hcl 100 Mg Tabs (Labetalol hcl) .Marland Kitchen... 1 1/2  tab morning and 2 tabs at bedtime    Aspirin 81 Mg Tbec (Aspirin) .Marland Kitchen... Take one tablet by mouth daily    Furosemide 20 Mg Tabs (Furosemide) .Marland Kitchen... Take 1 1/2 tablets two times a day    Clonidine Hcl 0.2 Mg Tabs (Clonidine hcl) .Marland Kitchen... Take one tablet by two times a day    Minoxidil 10 Mg Tabs (Minoxidil) .Marland KitchenMarland KitchenMarland KitchenMarland Kitchen  1/2 tab by mouth once daily  Orders: EKG w/ Interpretation (93000)  Patient Instructions: 1)  Your physician recommends that you schedule a follow-up appointment in: 3 MONTHS WITH DR CRENSHAW OCT 40,9811 AT 3:00 PM 2)  Your physician recommends that you return for lab work [id number]:[id number] BNP TODAY 401.1 3)  Your physician has requested that you have an echocardiogram.  Echocardiography is a painless test that uses sound waves to create images of your heart. It provides your doctor with information about the size and shape of your heart and how well your heart's chambers and valves are working.  This procedure takes approximately one hour. There are no restrictions for this procedure.June 10, 2010 AT 1:00 PM 4)  Your physician has recommended you make the following change in your medication: LABETALOL 500 MG two times a day

## 2010-12-07 NOTE — Progress Notes (Signed)
  Phone Note Outgoing Call   Call placed by: Scherrie Bateman, LPN,  June 02, 2010 4:34 PM Call placed to: Patient Summary of Call: LEFT MESSAGE FOR PT TO CALL BACK RE INCREASEING LABATELOL TO 500 MG  BID  NEEDED TO REITTERATE   AND TO CALL NEW DOSE IN TO [facility]. Initial call taken by: Scherrie Bateman, LPN,  June 02, 2010 4:36 PM    New/Updated Medications: LABETALOL HCL 100 MG TABS (LABETALOL HCL) 2 1/2 TABS two times a day Prescriptions: LABETALOL HCL 100 MG TABS (LABETALOL HCL) 2 1/2 TABS two times a day  #150 x 11   Entered by:   Scherrie Bateman, LPN   Authorized by:   Ferman Hamming, MD, [facility]   Signed by:   Scherrie Bateman, LPN on 81/19/1478   Method used:   Electronically to        [facility] (retail)       [address]       [place], [place]  [address]       Ph: [phone]       Fax: [phone]   RxID:   [id number]

## 2010-12-07 NOTE — Progress Notes (Signed)
Summary: [facility]   [facility]   Imported By: Roderic Ovens 04/27/2010 15:23:39  _____________________________________________________________________  External Attachment:    Type:   Image     Comment:   External Document

## 2010-12-07 NOTE — Progress Notes (Signed)
Summary: [facility] Office Note   [facility] Office Note   Imported By: Roderic Ovens 04/27/2010 15:27:42  _____________________________________________________________________  External Attachment:    Type:   Image     Comment:   External Document

## 2010-12-07 NOTE — Letter (Signed)
Summary: IR[facility]  IRIS   Imported By: Roderic Ovens 04/08/2010 11:50:26  _____________________________________________________________________  External Attachment:    Type:   Image     Comment:   External Document

## 2010-12-07 NOTE — Progress Notes (Signed)
Summary: Med List   Med List   Imported By: Roderic Ovens 04/27/2010 15:24:00  _____________________________________________________________________  External Attachment:    Type:   Image     Comment:   External Document

## 2010-12-07 NOTE — Letter (Signed)
[facility]   [facility] Office Note   Imported By: Roderic Ovens 04/27/2010 15:43:02  _____________________________________________________________________  External Attachment:    Type:   Image     Comment:   External Document

## 2010-12-07 NOTE — Progress Notes (Signed)
Summary: [facility] Office Note   [facility] Office Note   Imported By: Roderic Ovens 04/27/2010 15:26:15  _____________________________________________________________________  External Attachment:    Type:   Image     Comment:   External Document

## 2010-12-07 NOTE — Progress Notes (Signed)
Summary: [facility] Office Note   [facility] Office Note   Imported By: Roderic Ovens 04/27/2010 15:24:27  _____________________________________________________________________  External Attachment:    Type:   Image     Comment:   External Document

## 2010-12-07 NOTE — Progress Notes (Signed)
Summary: [facility] Office Note   [facility] Office Note   Imported By: Roderic Ovens 04/27/2010 15:25:09  _____________________________________________________________________  External Attachment:    Type:   Image     Comment:   External Document

## 2010-12-07 NOTE — Letter (Signed)
Summary: [facility] New Patient Evaluation  [facility] New Patient Evaluation   Imported By: Roderic Ovens 04/27/2010 15:39:35  _____________________________________________________________________  External Attachment:    Type:   Image     Comment:   External Document

## 2010-12-07 NOTE — Progress Notes (Signed)
Summary: med   Phone Note Call from Patient Call back at [phone]   Caller: Patient Reason for Call: Talk to Nurse Summary of Call: pt wants refill on procardia, does not know dosage, do not see it on med list Initial call taken by: Migdalia Dk,  December 01, 2009 2:41 PM  Follow-up for Phone Call        spoke with pt, she was recently seen by dr Astrid Divine. she has been having trouble with angioedema, dr Astrid Divine stopped her dioven/hct, as he thought that was causing her problem. at present she is taking clonidine 0.3mg  two times a day with and extra 0.3mg  at noon if bp elevated. bp 130/85 with the current meds. she called today because she has no edema but feels alittle SOB and was concerned about not taking  the HCTZ. she had lasix in the past but that was stopped the last hospitalization. she questioned if she needed to take lasix again. pt also reports being on prednisone for about 6 weeks now because of allergies. she is currently on the last week of prednisone.appt made for follow up with dr Jens Som 12-16-09. will foward to dr Jens Som for review. Deliah Goody, RN  December 01, 2009 4:55 PM  Additional Follow-up for Phone Call Additional follow up Details #1::        Lasix 20 mg by mouth daily as needed; f/u as scheduled Ferman Hamming, MD, [facility]  December 02, 2009 8:12 AM  spoke with pt, she took one lasix last night and feels alot better today. she will cont to take as needed and f/u as scheduled Deliah Goody, RN  December 02, 2009 2:00 PM]

## 2011-02-15 LAB — COMPREHENSIVE METABOLIC PANEL
ALT: 9 U/L (ref 0–35)
AST: 19 U/L (ref 0–37)
CO2: 28 mEq/L (ref 19–32)
Calcium: 8.3 mg/dL — ABNORMAL LOW (ref 8.4–10.5)
Chloride: 106 mEq/L (ref 96–112)
Creatinine, Ser: 0.98 mg/dL (ref 0.4–1.2)
GFR calc Af Amer: >60 mL/min (ref >60)
GFR calc non Af Amer: 59 mL/min — ABNORMAL LOW (ref >60)
Glucose, Bld: 114 mg/dL — ABNORMAL HIGH (ref 70–99)
Total Bilirubin: 0.7 mg/dL (ref 0.3–1.2)

## 2011-02-15 LAB — APTT: aPTT: 35 seconds (ref 24–37)

## 2011-02-15 LAB — BASIC METABOLIC PANEL
BUN: 6 mg/dL (ref 6–23)
Chloride: 99 mEq/L (ref 96–112)
Creatinine, Ser: 0.85 mg/dL (ref 0.4–1.2)
Glucose, Bld: 128 mg/dL — ABNORMAL HIGH (ref 70–99)
Potassium: 4 mEq/L (ref 3.5–5.1)

## 2011-02-15 LAB — CARDIAC PANEL(CRET KIN+CKTOT+MB+TROPI)
Total CK: 242 U/L — ABNORMAL HIGH (ref 7–177)
Troponin I: 0.02 ng/mL (ref 0.00–0.06)
Troponin I: 0.03 ng/mL (ref 0.00–0.06)

## 2011-02-15 LAB — D-DIMER, QUANTITATIVE: D-Dimer, Quant: 0.23 ug/mL-FEU (ref 0.00–0.48)

## 2011-02-15 LAB — POCT CARDIAC MARKERS

## 2011-02-15 LAB — DIFFERENTIAL
Basophils Relative: 1 % (ref 0–1)
Lymphocytes Relative: 24 % (ref 12–46)
Lymphs Abs: 1.4 10*3/uL (ref 0.7–4.0)
Monocytes Relative: 7 % (ref 3–12)
Neutro Abs: 3.8 10*3/uL (ref 1.7–7.7)

## 2011-02-15 LAB — POCT I-STAT, CHEM 8
Chloride: 107 mEq/L (ref 96–112)
Glucose, Bld: 85 mg/dL (ref 70–99)
HCT: 38 % (ref 36.0–46.0)
Potassium: 3.5 mEq/L (ref 3.5–5.1)

## 2011-02-15 LAB — PROTIME-INR: INR: 1.1 (ref 0.00–1.49)

## 2011-02-15 LAB — CBC
HCT: 35 % — ABNORMAL LOW (ref 36.0–46.0)
HCT: 38.9 % (ref 36.0–46.0)
Hemoglobin: 12 g/dL (ref 12.0–15.0)
Hemoglobin: 13.2 g/dL (ref 12.0–15.0)
MCV: 83.6 fL (ref 78.0–100.0)
Platelets: 241 10*3/uL (ref 150–400)
Platelets: 242 10*3/uL (ref 150–400)
RDW: 14.1 % (ref 11.5–15.5)
RDW: 14.1 % (ref 11.5–15.5)
WBC: 7.7 10*3/uL (ref 4.0–10.5)

## 2011-02-15 LAB — TROPONIN I: Troponin I: 0.01 ng/mL (ref 0.00–0.06)

## 2011-02-15 LAB — CK TOTAL AND CKMB (NOT AT ARMC): Relative Index: 0.8 (ref 0.0–2.5)

## 2011-02-21 LAB — CBC
HCT: 45.6 % (ref 36.0–46.0)
Hemoglobin: 14.9 g/dL (ref 12.0–15.0)
MCHC: 32.7 g/dL (ref 30.0–36.0)
MCV: 87.1 fL (ref 78.0–100.0)
Platelets: 212 10*3/uL (ref 150–400)
RDW: 14.4 % (ref 11.5–15.5)

## 2011-02-21 LAB — DIFFERENTIAL
Basophils Absolute: 0 10*3/uL (ref 0.0–0.1)
Basophils Relative: 0 % (ref 0–1)
Eosinophils Absolute: 0.1 10*3/uL (ref 0.0–0.7)
Eosinophils Relative: 2 % (ref 0–5)
Monocytes Absolute: 0.6 10*3/uL (ref 0.1–1.0)

## 2011-02-21 LAB — URINALYSIS, ROUTINE W REFLEX MICROSCOPIC
Nitrite: NEGATIVE
Protein, ur: NEGATIVE mg/dL
pH: 6 (ref 5.0–8.0)

## 2011-02-21 LAB — POCT I-STAT, CHEM 8
Calcium, Ion: 1.04 mmol/L — ABNORMAL LOW (ref 1.12–1.32)
Chloride: 104 mEq/L (ref 96–112)
Glucose, Bld: 94 mg/dL (ref 70–99)
HCT: 45 % (ref 36.0–46.0)
TCO2: 25 mmol/L (ref 0–100)

## 2011-03-02 ENCOUNTER — Encounter: Payer: Self-pay | Admitting: Women's Health

## 2011-03-09 ENCOUNTER — Encounter (INDEPENDENT_AMBULATORY_CARE_PROVIDER_SITE_OTHER): Payer: BC Managed Care – PPO | Admitting: Women's Health

## 2011-03-09 ENCOUNTER — Other Ambulatory Visit (HOSPITAL_COMMUNITY)
Admission: RE | Admit: 2011-03-09 | Discharge: 2011-03-09 | Disposition: A | Discharge status: Home / Self Care | Payer: BC Managed Care – PPO | Source: Ambulatory Visit | Attending: Obstetrics and Gynecology | Admitting: Obstetrics and Gynecology

## 2011-03-09 ENCOUNTER — Other Ambulatory Visit: Payer: Self-pay | Admitting: Women's Health

## 2011-03-09 DIAGNOSIS — Z1159 Encounter for screening for other viral diseases: Secondary | ICD-10-CM | POA: Insufficient documentation

## 2011-03-09 DIAGNOSIS — R809 Proteinuria, unspecified: Secondary | ICD-10-CM

## 2011-03-09 DIAGNOSIS — N951 Menopausal and female climacteric states: Secondary | ICD-10-CM

## 2011-03-09 DIAGNOSIS — Z01419 Encounter for gynecological examination (general) (routine) without abnormal findings: Secondary | ICD-10-CM

## 2011-03-09 DIAGNOSIS — Z124 Encounter for screening for malignant neoplasm of cervix: Secondary | ICD-10-CM | POA: Insufficient documentation

## 2011-03-22 NOTE — Consult Note (Signed)
NAMEMERRIT, WAUGH                 ACCOUNT NO.:  [id number]   MEDICAL RECORD NO.:  [id number]          PATIENT TYPE:  INP   LOCATION:  3023                         FACILITY:  [facility]   PHYSICIAN:  Luis Abed, MD, FACCDATE OF BIRTH:  03-Mar-1951   DATE OF CONSULTATION:  08/15/2008  DATE OF DISCHARGE:                                 CONSULTATION   HISTORY:  Cardiology is consulted to see [name] [name] concerning some arm  pain and for the  followup of her blood pressure.  The patient was admitted with a stroke.  This has been felt not to be embolic.  She has had a subcortical infarct  with right hemiparesis affecting her right arm, right leg and the speech  pattern.  Her right leg is improving.  Speech has improved somewhat.  She still has weakness in her right arm.  The patient has had some pain  in her right arm.  The question is whether or not it is cardiac.  There  is no proven coronary disease.  She has good LV function by echo,  although the echo was technically difficult.   Historically, the patient has been followed for significant hypertension  by Dr. Antoine Poche.  Usually her blood pressures run quite high and he has  been adjusting her medications in an attempt to control her blood  pressure.  She in fact was seen as recently as August 11, 2008 in the  office.  I am not aware of any significant cardiac testing in the past,  specifically as it relates to coronary artery disease.  There is no  history of a cardiac catheterization.   The patient was admitted with her stroke and she has been stabilizing.  However, in the past day or 2, her blood pressure has been on the low  side for her.  Her systolics have ranged from 90-110.  Yesterday, all of  her diuretics were put on hold.  However, her other blood pressure  medications were continued.   Concerning the patient's arm pain, she has not had any nausea, vomiting  or diaphoresis.  It is difficult for her to explain the pain.   When  occupational therapy saw her this afternoon, they felt that there was  some difficulty with her range of motion and that it might be decreased  since yesterday.  There is question as to whether or not this could be  musculoskeletal pain and that her pain response is complicated by the  stroke in this area.   ALLERGIES:  1. There is a history that ACE INHIBITORS cause a cough.  2. NORVASC causes flushing.  3. DEMEROL causes pruritus.   MEDICATIONS:  When she came in the hospital she was on:  1. Wellbutrin 300.  2. Labetalol 400 b.i.d.  3. Xanax 0.5 t.i.d.  4. Lasix 40 (on hold at this time).  5. Clonidine 0.3 b.i.d.  6. Atacand/hydrochlorothiazide (the hydrochlorothiazide is on hold at      this time).  7. Protonix 40.  8. Aspirin 325.  9. Galantamine ER 16 mg daily.  10.Spironolactone 25  mg daily (on hold).   OTHER MEDICAL PROBLEMS:  See the list below.   SOCIAL HISTORY:  The patient is married and her husband is in the room  at the time of my evaluation.  She does not smoke.   FAMILY HISTORY:  Noncontributory at this time.   REVIEW OF SYSTEMS:  At this time, she is not complaining of any fevers  or chills.  She does not have a headache.  She is not having any eye  difficulties.  There is no shortness of breath.  She does not have any  arm discomfort at this time.  There is no nausea.  She is not having any  urinary symptoms.  She is not having any pain in her legs.  Otherwise,  review of systems is negative.   PHYSICAL EXAMINATION:  VITAL SIGNS:  Current blood pressure is 110/60  with a pulse of 82, temperature is 97.7.  GENERAL:  The patient is  oriented to person, time and place.  Affect is normal.  The patient's  husband is in the room.  Her speech is affected by drooping of the right  face.  HEENT:  Reveals no xanthelasma.  NECK:  I do not hear any carotid bruits.  LUNGS:  Clear.  Respiratory effort is normal.  CARDIAC:  Reveals an S1-S2.  There are no clicks  or significant murmurs.  ABDOMEN:  Soft.  She has no masses or bruits.  EXTREMITIES:  There are good distal pulses.  There is no significant  peripheral edema.   DIAGNOSTICS:  1. EKGs reveal nonspecific ST/T wave changes.  2. A 2-D echo was technically very difficult.  Left ventricular      function is thought to be good.  Specific walls cannot be assessed.      The right heart could not be seen.   LABORATORY DATA:  Her most recent BUN is 21 with a creatinine of 1.4 and  potassium of 4.7.  Her first troponin today is 0.01.  TSH was normal.   PROBLEMS:  1. Intolerance to ACE inhibitors.  2. Very difficult to control hypertension, historically.  3. Current relative hypotension.  4. Question of sleep apnea.  5. Depression.  6. History of a tremor.  7. Arm pain.  At this point, I doubt cardiac pain.  As mentioned      above, there is some question that she may be having difficulty      with arm movement.  Her first troponin is normal.  There are no EKG      changes.  I would await further cardiac enzymes.  If her troponins      are normal, no further cardiac workup is needed.  She may need      other workup of her shoulder pain.  8. Cerebrovascular accident this admission.  This is improving.  It is      not felt to be embolic.  9. Good left ventricular function by 2-D echo, although the study is      difficult.   I am concerned that the patient's blood pressure is on the low side for  a lady who normally has very high blood pressure.  Her diuretics are on  hold.  She continues on Normodyne 400 b.i.d.  I have decreased her  clonidine to 0.2 b.i.d.  She continues on olmesartan.  I would follow  her blood pressure in the hospital to be sure that it has stabilized  and aim for a  systolic in the range of 120-145.  If she does go home  soon, she needs very early follow up with Dr. Antoine Poche to recheck her  blood pressure and to see if there is a need for the resumption of her  diuretics  at some point.  As mentioned above, I will follow up cardiac  enzymes.  If they remain normal, no further cardiac workup.      Luis Abed, MD, [facility]  Electronically Signed     JDK/MEDQ  D:  08/15/2008  T:  08/15/2008  Job:  [id number]   cc:   [name] Rotunda, MD, [facility]  [name] [name], D.O.

## 2011-03-22 NOTE — Consult Note (Signed)
[name] [name], [name] [name]                 ACCOUNT NO.:  [id number]   MEDICAL RECORD NO.:  [id number]          PATIENT TYPE:  INP   LOCATION:  3023                         FACILITY:  [facility]   PHYSICIAN:  Pramod P. Pearlean Brownie, MD    DATE OF BIRTH:  September 12, 1951   DATE OF CONSULTATION:  DATE OF DISCHARGE:                                 CONSULTATION   REFERRING PHYSICIAN:  [facility].   REASON FOR REFERRAL:  Stroke.   HISTORY OF PRESENT ILLNESS:  [name] [name] is a 60 year old Caucasian lady  who developed sudden onset of right face and upper extremity weakness  and numbness.  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  Her vascular risk factors  include hypertension and borderline hyperlipidemia.  She has been on  aspirin a day.   PAST MEDICAL HISTORY:  Anxiety, depression, vascular Parkinsonism,  gastroesophageal reflux disease, hypertension, right upper extremity  tremor.   PAST SURGICAL HISTORY:  Hysterectomy, bladder surgeries, lumpectomy,  tubal ligation.   HOME MEDICATION:  Lasix, Wellbutrin, Protonix, labetalol, clonidine,  aspirin, Xanax, galantamine, spironolactone.   MEDICATION ALLERGIES:  NORVASC.   FAMILY HISTORY:  Nonsignificant family with strokes or Parkinson's.   SOCIAL HISTORY:  The patient is married and lives with her husband.  She  used to smoke, but has quit.   REVIEW OF SYSTEMS:  Negative for recent fever, cough, chest pain,  diarrhea, or other illness.   PHYSICAL EXAMINATION:  GENERAL:  A pleasant middle-aged Caucasian lady  who is at present not in distress.  VITAL SIGNS:  She is afebrile, pulse rate is 70 per minute, regular  respiratory rate 16 per minute, distal pulses were felt, blood pressure  102/69, respiratory rate 18 per minute.  HEENT:  Head is atraumatic.  ENT exam is unremarkable.  NECK:  Supple.  There is no bruit.  CARDIAC:   No murmur or gallop.  NEUROLOGIC:  She is pleasant, awake, alert, and cooperative.  There is  no aphasia, but dysarthria.  Eye movements are full range.  She has  right lower facial weakness.  There is decreased facial expression.  Glabellar tap is positive.  Tongue is midline.  Motor system exam  reveals no upper extremity drift, however, fine finger movements are  diminished in the right hand.  She orbits left over right upper  extremity.  Right grip is weaker than the left.  She has symmetric lower  extremity strength, tone, reflexes, coordination, and sensation.  She  has mild intermittent tremor of the right upper extremity when it is  held outstretched.  Gait was not tested.   Data reviewed.  MRI scan of the brain shows left corona radiata, acute  infarct.  MRA of the brain shows no large or medium vessel stenosis.  Carotid ultrasound shows no hemodynamically significant stenosis in the  neck.  A 2-D echo shows normal ejection fraction.  Hemoglobin A1c is  normal.  Total cholesterol is normal.  LDL is borderline at 105.  IMPRESSION:  A 60 year old lady with left subcortical infarct secondary  to small-vessel disease, vascular risk factors of hypertension,  borderline hyperlipidemia, and prior strokes.   PLAN:  I agree with changing to Plavix for secondary stroke prevention,  but discontinue aspirin as there is no advantage of combination therapy.  Consider low-fat diet and exercise and repeat lipid profile in 6 weeks.  If LDL is still above 100, add statins.  Tight control of hypertension  with systolic blood pressure below 010.  The patient will also need  physical and occupation therapy.  The patient may also consider to  participate in the IRIS stroke prevention trial.  I have discussed this  briefly with the patient and her husband.  They are interested.  We will  give them more information to review at home.  She will follow up with  me in the future as needed.  Thank you  for the referral.           ______________________________  Sunny Schlein. Pearlean Brownie, MD     PPS/MEDQ  D:  08/13/2008  T:  08/14/2008  Job:  [id number]

## 2011-03-22 NOTE — Assessment & Plan Note (Signed)
[facility]                            CARDIOLOGY OFFICE NOTE   NAME:Macaulay, DOMINICA KENT                        MRN:          [id number]  DATE:01/30/2008                            DOB:          November 13, 1950    The primary is Dr. Marisue Brooklyn.   REASON FOR PRESENTATION:  Evaluate patient with difficult to control  hypertension.   HISTORY OF PRESENT ILLNESS:  The patient is 60 years old.  She presents  for management of the above.  Dr. Elisabeth Most has been following her  routinely and noted her blood pressure has continued to be elevated.  She was recently started on Cardura (I am not sure of the dose).  Ms.  Loeb reports at times her blood pressure goes up to systolic range to  the 230s and diastolic to the 120s.  She will sit down, take an extra  clonidine, and relax.  She says that overall her blood pressure has been  running in the 150s or 160s with diastolics in the 90s.  She has only  been on the Cardura for about 10 days.   She has no new cardiac complaints.  She not describing any new shortness  breath, PND, or orthopnea.  She has not been having any new chest  discomfort, neck, or arm discomfort.  She continues to have problems  with her tremor.  She has lots of stress in her life, and she thinks  this is related clearly to her blood pressure.   PAST MEDICAL HISTORY:  1. Depression.  2. Hypertension.  3. Tremor of unclear etiology.  4. Questionable sleep apnea.  5. Left knee replacement.  6. Hysterectomy.  7. Stamey procedure.   ALLERGIES:  INTOLERANCE TO ACE INHIBITOR.   MEDICATIONS:  1. Wellbutrin 300 mg daily.  2. Exelon patch.  3. Labetalol 400 mg b.i.d.  4. Xanax.  5. Lasix 40 mg daily.  6. Clonidine 0.3 mg b.i.d.  7. Atacand 16/12.5 two tablets daily  8. Cardizem 120 mg b.i.d.  9. Cymbalta 60 mg daily.  10.Symbicort.  11.Cardura (dose not known).   REVIEW OF SYSTEMS:  As stated in the HPI and otherwise negative for  other  systems.   PHYSICAL EXAMINATION:  The patient is in no distress.  Blood pressure  142/91, heart rate 86 and regular, weight 179 pounds, body mass index  32.  HEENT:  Eyelids unremarkable; pupils equal, round, reactive to light;  fundi not visualized.  NECK:  No jugular venous distention at 45 degrees; carotid upstrokes  brisk and symmetric;  no bruits, no thyromegaly.  LYMPHATICS:  No adenopathy.  LUNGS:  Clear to auscultation bilaterally.  BACK:  No costovertebral angle tenderness.  CHEST:  Unremarkable.  HEART:  PMI not displaced or sustained; S1-S2 within normal limits; no  S3, no S4, no clicks, no rubs, no murmurs.  ABDOMEN:  Flat, soft, positive bowel sounds, normal in frequency and  pitch, no bruits, no rebound, no guarding, no midline pulsatile mass, no  hepatomegaly, no splenomegaly.  SKIN:  No rashes, no nodules.  EXTREMITIES:  2+ pulse throughout,  no cyanosis, no clubbing, no edema.  NEUROLOGICAL:  Oriented to person, place, and time; resting tremor;  cranial nerves grossly intact; motor grossly intact.   EKG:  Sinus rhythm, rate 82, right axis deviation, intervals within  normal limits, poor anterior R wave progression, borderline low voltage  in the limb leads.   ASSESSMENT/PLAN:  1. Hypertension.  The patient's blood pressure seems to be difficult      to control, but certainly some of it is related to stress.  We      discussed how we would treat the periodic spikes as would probably      would not be able to completely avoid these.  She can take an extra      clonidine and go and relax.  She might take her Xanax if it is due      at that time as well.  For now, I think she ought to remain on the      current regimen and let the Cardura work longer.  She also needs to      get her sleep apnea treated.  She does not wear her CPAP.  She      understands that her blood pressure may remain very difficult to      control if she has untreated sleep apnea.  Finally, I  would suggest      using spironolactone 25 mg daily if her blood pressures remain      elevated.  I will defer to Dr. Elisabeth Most.  When I use this drug, I      do check baseline BMET and then a BMET in one week, one month, and      two to three times per year thereafter.  I warned the patient's      about the possibility of hyperkalemia.  It is otherwise a fairly      straight forward drug to take and recommended by the American      Hypertension Society for refractory hypertension.  2. Dyspnea.  This is unchanged.  No further workup as planned.  3. Follow-up.  I was due to see her back in October, and she will keep      this appointment or      sooner if there are any further questions or problems with her      blood pressure or other cardiac issues.     Rollene Rotunda, MD, [facility]  Electronically Signed    JH/MedQ  DD: 01/30/2008  DT: 01/31/2008  Job #: [id number]   cc:   Lovenia Kim, D.O.

## 2011-03-22 NOTE — Discharge Summary (Signed)
[name] [name], [name] [name]                 ACCOUNT NO.:  [id number]   MEDICAL RECORD NO.:  [id number]          PATIENT TYPE:  INP   LOCATION:  3742                         FACILITY:  [facility]   PHYSICIAN:  Charlestine Massed, MDDATE OF BIRTH:  07/09/1951   DATE OF ADMISSION:  03/10/2009  DATE OF DISCHARGE:  03/14/2009                               DISCHARGE SUMMARY   PRIMARY CARE PHYSICIAN:  Lovenia Kim, DO   CARDIOLOGY:  Rollene Rotunda, MD, [facility] at [facility].   REASON FOR ADMISSION:  Chest pain and mild nausea.   DISCHARGE DIAGNOSES:  1. Hypertensive emergency with uncontrolled hypertension at admission.  2. Acute renal failure secondary to hypertensive emergency, completely      resolved.  3. History of anxiety and depression.  4. History of gastroesophageal reflux disease.  5. History of chronic tremor.  6. History of migraine headaches.  7. Insomnia.  8. History of stroke, recently with right-sided facial droop and      hemiparesis, which has resolved almost completely.   DISCHARGE MEDICATIONS:  1. Procardia XL 120 mg p.o. daily.  2. Diovan/hydrochlorothiazide 320/25 one tablet p.o. q.a.m.  3. Procardia daily p.m.  4. Aspirin 81 mg p.o. daily.  5. Labetalol 300 mg p.o. b.i.d.  6. Ambien 5 mg p.o. nightly p.r.n.  7. Potassium chloride 10 mEq p.o. daily.   Existing medications to be continued are:  1. Wellbutrin XR 300 mg p.o. daily.  2. Protonix 40 mg p.o. daily.  3. Zocor 20 mg p.o. daily.  4. Plavix 75 mg p.o. daily.  5. Galantamine 8 mg p.o. b.i.d.  6. Xanax 0.5 mg t.i.d. p.r.n. as needed.   Medications that have been changed or stopped are:  1. Atacand stopped.  2. Clonidine stopped.  3. Cardizem stopped.  4. Labetalol changed to 300 mg b.i.d.   HOSPITAL COURSE:  1. Hypertensive emergency.  The patient was admitted on May 5 with      complaints of chest pain and dizziness and nausea.  The blood      pressure at the time of admission was extremely high  and she was      given minoxidil first and then she was also started on Nipride      drip.  The patient's blood pressure stabilized very well after      that.  She had a chest pain at the time of high blood pressure, so      dissection of the aorta was ruled out by getting a CT angio.  The      patient's condition has improved gradually.  She was complaining of      insomnia for almost 3 days.  At the time of admission, she was      given a dose of Ambien after which she slept.  After she had a nice      sleep, she was looking much better without any distress.  Her blood      pressure stabilized rapidly.  Her medications were adjusted with      new medications added and currently, the patient is  stable and      ambulatory, and she is being discharged today.  2. Prior history of stroke.  Currently, the patient was on Plavix when      she came, aspirin 81 mg has also been added.  No new issues have      been reported with that.  3. History of dyslipidemia.  Currently, stable.  We will continue      Zocor as she was on before.  4. History of Parkinson disease.  Currently, she has only some benign      tremor for which she does not have any medication.  She was being      continued on a galantamine for that.  She does not have much of      symptoms now and so she can be followed up as outpatient by PMD for      that.  5. Gastroesophageal reflux disease.  Continue Protonix.   DISPOSITION:  The patient is discharged back home.   FOLLOWUP:  1. Follow up with Dr. Lovenia Kim in 1 week to have a blood      pressure check as well as to get a electrolyte checked as she is on      hydrochlorothiazide.  The patient has been instructed to do so, and      she exhibits understanding.  2. Follow up with Cardiology as required.  At this time, there are no      further recommendations for that.   DISCHARGE INSTRUCTIONS:  1. The patient has been advised to stick to a 2-g sodium that is low-       sodium, heart-healthy diet.  2. To observe signs of any electrolyte depletion such as cramps and      report to the MD immediately.  3. The patient has a tendency to develop nausea at times of high blood      pressure and the patient has been instructed to check blood      pressure immediately whenever she experiences nausea or dizziness.   A total of 40 minutes was spent on this discharge.  She will be observed  on the floor today for some more time and when blood pressure is stable,  she will be discharged.      Charlestine Massed, MD  Electronically Signed     UT/MEDQ  D:  03/14/2009  T:  03/15/2009  Job:  [id number]   cc:   Lovenia Kim, D.O.  Rollene Rotunda, MD, [facility]

## 2011-03-22 NOTE — H&P (Signed)
[name] [name], [name] [name]                 ACCOUNT NO.:  [id number]   MEDICAL RECORD NO.:  [id number]          PATIENT TYPE:  INP   LOCATION:  3023                         FACILITY:  [facility]   PHYSICIAN:  Hillery Aldo, M.D.   DATE OF BIRTH:  04/27/1951   DATE OF ADMISSION:  08/12/2008  DATE OF DISCHARGE:                              HISTORY & PHYSICAL   PRIMARY CARE PHYSICIAN:  Lovenia Kim, D.O.   CHIEF COMPLAINT:  Right-sided facial droop with slurred speech and right  hemiparesis.   HISTORY OF PRESENT ILLNESS:  The patient is a 60 year old female with a  past medical history of difficult to control hypertension who woke from  a nap this afternoon with a noticeable facial droop and difficulty fully  opening her right eyelid.  She also endorsed headache in the bilateral  areas behind her ears, greater on the left side.  The patient also  complains of right upper extremity weakness and numbness, particularly  affecting the thumb and pointy finger.  She denies noticing any problems  with her gait or balance.  Upon initial evaluation in the emergency  department, she initially past a swallowing screen and then when been  given aspirin, had a choking episode.  She is being admitted for  evaluation and workup of possible stroke.   PAST MEDICAL HISTORY:  1. Difficult to control hypertension.  2. Anxiety.  3. Depression.  4. Parkinson disease.  5. Gastroesophageal reflux disease.  6. Chronic tremor.  7. Status post hysterectomy.  8. Status post bladder surgery.  9. Status post left total knee replacement.  10.Status post lumpectomy.  11.Bilateral tubal ligation.  12.History of migraine headaches.   FAMILY HISTORY:  The patient's mother is alive at 47 and has  hypertension.  The patient's father is alive at 50 and had a MI in his  79s.  He also has COPD.  The patient has 2 sisters and 1 brother who are  all healthy.  She has a daughter who is bipolar.   SOCIAL HISTORY:  The patient  is married times 35 years.  She is a  lifelong nonsmoker.  She drinks alcohol rarely on social occasions,  mainly a glass of wine.  Denies any drug use.  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   ALLERGIES:  ACE inhibitors cause cough, Norvasc causes flushing, and  Demerol causes pruritus.   CURRENT MEDICATIONS:  1. Wellbutrin 300 mg daily.  2. Labetalol 400 mg b.i.d.  3. Xanax 0.5 mg t.i.d. p.r.n.  4. Lasix 40 mg daily.  5. Clonidine 0.3 mg b.i.d.  6. Atacand HCT 32/25 daily.  7. Protonix 40 mg daily.  8. Aspirin 325 mg daily.  9. Galantamine ER 16 mg daily.  10.Spirolactone 25 mg daily.   REVIEW OF SYSTEMS:  A comprehensive 14 point review of systems is  unremarkable with the exception of the elements as noted in the HPI  above.   PHYSICAL EXAMINATION:  VITAL SIGNS:  Temperature 97.6, pulse 69,  respirations 20, blood pressure 119/58, O2 saturation 96% on room  air.  GENERAL:  Well-developed, well-nourished female in no acute distress.  HEENT:  Normocephalic, atraumatic.  PERRL.  EOMI.  Visual fields are  full.  Oropharynx is clear with slight tongue deviation to the right.  NECK:  Supple.  No thyromegaly, no lymphadenopathy, no jugular venous  distention.  No carotid bruits appreciated.  CHEST:  Clear to auscultation bilaterally with good air movement.  Heart:  Regular rate, rhythm.  No murmurs, rubs or gallops.  ABDOMEN:  Soft, nontender, nondistended with normoactive bowel sounds.  EXTREMITIES:  No clubbing, edema, or cyanosis.  SKIN:  Dry.  No rashes.  NEUROLOGIC:  The patient is alert and oriented x3.  Cranial nerves II-  XII do show some facial weakness on the right with the tongue deviation.  She moves her right upper extremity with 4/5 strength when compared to  the left 5/5 strength.  Lower extremities are 05/05 bilaterally.  Interestingly, there is no significant pronator drift although testing  of her upper extremities does reveal a  fairly evident weakness on the  right compared to the left.   DATA REVIEW.:  CT scan of the head without contrast shows no discrete  acute abnormalities.  Progressive chronic small vessel ischemic changes.  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   MRI scan is pending.   LABORATORY DATA:  Stroke panel shows a white blood cell count of 6.2,  hemoglobin 13.7, hematocrit 40.8, platelets 254.  PT is 13.4, PTT 27,  sodium 138, potassium 3.5, chloride 106, bicarb 26, glucose 124, BUN 13,  creatinine 1.36, total bilirubin 0.5, alkaline phosphatase 82, AST 24,  ALT 23, total protein 6.4, albumin 3.6, calcium 9.4, creatine kinase 98,  CK-MB 1.3, troponin I 0.01.   ASSESSMENT/PLAN:  1. Acute CVA:  We will admit the patient and follow up on the MRI/MRA      scan results.  We will obtain carotid Dopplers as well as 2-      dimensional echocardiography although, her stroke is most likely      due to progressive small vessel disease.  Check her fasting lipid      panel to ensure she has good lipid control.  We will check her      homocystine level.  Check her hemoglobin A1c to screen for      diabetes.  Given that she has failed aspirin mono therapy, we will      add Plavix to her regimen.  She will need a very strict risk factor      modification.  Given her known cerebrovascular disease and likely      recurrent stroke.  We will obtain a PT, OT and speech therapy      consultation.  2. Hypertension:  Resume all of her home antihypertensives and monitor      her for control.  3. Depression/anxiety:  Continue the patient's usual medications      including Wellbutrin and Xanax.  4. Prophylaxis:  We will administer subcutaneous heparin for deep vein      thrombosis prophylaxis and continue her proton pump inhibitor for      stress ulcer prophylaxis.      Hillery Aldo, M.D.  Electronically Signed     CR/MEDQ  D:  08/12/2008  T:  08/13/2008  Job:  [id number]   cc:   Lovenia Kim, D.O.

## 2011-03-22 NOTE — H&P (Signed)
[name], [name]                 ACCOUNT NO.:  [id number]   MEDICAL RECORD NO.:  [id number]          PATIENT TYPE:  INP   LOCATION:  2312                         FACILITY:  [facility]   PHYSICIAN:  Darryl D. Prime, MD    DATE OF BIRTH:  1951-09-02   DATE OF ADMISSION:  03/10/2009  DATE OF DISCHARGE:                              HISTORY & PHYSICAL   The patient is a full code.   PRIMARY CARE PHYSICIAN:  Lovenia Kim, D.O.   CARDIOLOGIST:  Rollene Rotunda, MD, [facility] [place].   CHIEF COMPLAINTS:  Chest pain.   HISTORY OF PRESENT ILLNESS:  [name] [name] is a 60 year old female with a  history of a stroke, left subcortical infarct, in October of 2009.  She  had a right-sided facial droop and hemiparesis with slurred speech at  that time.  Symptoms are largely resolved now except for mild weakness  in the right hand and minor weakness in the right leg who presents with  chest pain.  The patient notes she has had no sleep for the last 2 days  because she has been stressed with her daughter.  The patient had chest  pain starting the morning of admission, Mar 10, 2009, around 3:00 a.m.,  substernal with no radiation, sharp, with associated nausea and  shortness of breath.  No sweats.  She had no emesis.  Chest pain was  8/10.  She took her home medications which did not help her symptoms  whatsoever.  She eventually presented to the emergency room.  She was  given medication in the emergency room with minimal relief, 6/10 chest  pain.  She was given Minoxidil 5 mg p.o.  The patient's chest pain at  the time of the interview increased a 8/10.   PAST MEDICAL AND PAST SURGICAL HISTORY:  1. She has a history of stroke as above.  2. History of severe hypertension, difficult to control.  3. History of anxiety.  4. History of depression.  5. History of Parkinson's disease.  6. History of gastroesophageal reflux disease.  7. History of chronic tremor.  8. Status post hysterectomy.  9. Status  post bladder surgery.  10.Status post left knee replacement.  11.Status post lumpectomy.  12.Bilateral tubal ligations.  13.History of migraine headaches.   MEDICATIONS:  1. Atacand 16/12.5 p.o. twice a day.  2. Clonidine 0.3 mg two times a day.  3. Wellbutrin 300 mg daily.  4. Protonix 40 mg daily.  5. Zocor 20 mg daily.  6. Labetalol 400 mg twice a day.  7. Galantamine 16 mg daily.  8. Cardizem 120 mg twice a day recently started by [facility].  9. Plavix 75 mg daily.  10.Xanax 8.5 mg as needed.   SOCIAL HISTORY:  She is married x35 years.  She is a lifelong smoker.  She drinks alcohol rarely on social occasions.  No illicit drug use.  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   FAMILY HISTORY:  Mother is alive at 45 and has hypertension.  Father had  an MI in his 50s.  He has had COPD.  The patient has 2 sisters and 1  brother who are all healthy.  Daughter is bipolar.   ALLERGIES:  1. ACE INHIBITORS WHICH CAUSE A COUGH.  2. NORVASC CAUSES FLUSHING.  3. DEMEROL CAUSES PRURITUS.   REVIEW OF SYSTEMS:  A 14-point review of systems negative unless stated  above.  She also notes significant headache.   PHYSICAL EXAMINATION:  VITAL SIGNS:  Temperature is 97.6 with a blood  pressure of 209/92 ,decreased briefly to 155/82 in the emergency room.  The patient was also given low-dose Ativan and Minoxidil to control her  blood pressure.  Respiratory rate is 14, saturation at 100% on 2 liters  nasal cannula.  Pulse of 78.  GENERAL:  The patient is a well-developed, well-nourished female in no  acute distress.  HEENT:  Normocephalic, atraumatic.  Pupils are equal, round and reactive  to light with extraocular movements being intact.  Visual fields are  full.  The oropharynx is clear tongue midline.  NECK:  Supple with no thyromegaly.  No lymphadenopathy.  No jugular  venous distention.  No coronary bruits.  LUNGS:  Clear to auscultation bilaterally.   Clear movement.  HEART:  Regular rate and rhythm with no murmurs, rubs or gallops.  ABDOMEN:  Soft, nontender, nondistended with normoactive bowel sounds.  EXTREMITIES:  No clubbing, cyanosis or edema.  SKIN:  Dry with no rashes.  NEUROLOGIC:  She is alert and oriented x4.  Cranial nerves II-XII  grossly intact except for minor facial weakness on the right.  The  patient does have 4+ strength on the right upper extremity and left  upper and lower extremity is 5/5.   LABORATORY DATA:  She has a white count of 5.8 with a hemoglobin of  13.2, hematocrit 38.9, platelets 242, segs of 60%.  Sodium of 141 with  potassium of 3.5, chloride 107, bicarbonate 25, BUN 6, creatinine 1.2  with a glucose of 85.  Cardiac markers at 1600 were negative.  CT of the  head showed extensive chronic microvascular changes with a lacunar  infarct on the left.  No acute changes.  Chest x-ray showed no acute  cardiopulmonary disease.  No change from April 08, 2007.  EKG showed sinus  rhythm with a ventricular rate of 68 beats per minute with right axis  deviation.  No change from October of 2009.  QT corrected at 476.   ASSESSMENT AND PLAN:  This is a patient with a history of severe  hypertension.  She also has a history of sleep apnea which she failed to  mention, and she has not taken her CPAP machine because has problems  tolerating it, who has now unexplained uncontrolled blood pressure  despite her medications.  She has been recently stressed and has had  poor sleep which may account for some of it.  The patient notes she has  been evaluated for secondary causes of hypertension in the past.  The  patient at this time will be admitted.  We will give her high dose  labetalol and minoxidil.  If we cannot control this, will give Nipride.  If her chest pain were to persist, will get a CT to rule out dissection,  particularly if she complains of radiation.  The patient will get an MRA  to evaluate her renal  arteries, and will also get a plasma aldosterone  concentration and a plasma renin activity.  Will check a D-dimer to rule  out possible pulmonary embolus.  Cardiac markers x2 will be ordered.  She will be given aspirin daily.  If all of her workup is negative,  consideration for possible cardiac   Dictation ended at this point.      Darryl D. Prime, MD  Electronically Signed     DDP/MEDQ  D:  03/11/2009  T:  03/11/2009  Job:  [id number]

## 2011-03-22 NOTE — Assessment & Plan Note (Signed)
[facility]                            CARDIOLOGY OFFICE NOTE   NAME:[name] [name], [name] [name]                        MRN:          [id number]  DATE:09/17/2008                            DOB:          Nov 12, 1950    [name] [name] is a very pleasant 60 year old previously followed by Dr.  Antoine Poche for severe hypertension.  She is now presenting in [place]  as this is a more convenient location.  Note, she has had a Myoview  performed on September 06, 2004.  Her ejection fraction was 75% and has no  ischemia.  She does have a history of elevated catecholamines, but a  negative MIBG for pheochromocytoma.  She has had difficult to control  hypertension for years.  She was recently admitted to [facility] on August 12, 2008, and diagnosed with a left subcortical  infarct/CVA.  During that admission, her blood pressure dropped  transiently and this was associated with dizziness, nausea and vomiting,  and her creatinine increased transiently.  Her blood pressure  medications were reduced.  She did have an echocardiogram during that  admission that showed technically difficult study, but normal LV  function.  There was moderate LVH.  After discharge, her blood pressure  is increased and Dr. Antoine Poche was contacted and apparently recently  resumed her Atacand HCT.  This was just 4 days ago.  She does state that  her blood pressure continues to run high.  She denies any dyspnea on  exertion, orthopnea, PND, pedal edema, palpitations, presyncope,  syncope, or chest pain.  She does have residual effects including right  upper extremity weakness from her recent CVA.   MEDICATIONS:  1. Clonidine 0.2 mg p.o. b.i.d.  2. Atacand HCT 16/12.5 b.i.d.  3. Labetalol 400 mg p.o. b.i.d.  4. Wellbutrin 300 mg p.o. daily.  5. Protonix 40 mg p.o. daily.  6. Galantamine.  7. Plavix 75 mg p.o. daily.  8. Zocor 20 mg p.o. nightly.   PHYSICAL EXAMINATION:  VITAL SIGNS:  Today,  shows a blood pressure that  is elevated at 185/101.  Her pulse is 83.  She weighs 182 pounds.  HEENT:  Normal.  NECK:  Supple.  CHEST:  Clear.  CARDIOVASCULAR:  Regular rhythm.  ABDOMEN:  No tenderness.  EXTREMITIES:  No edema.   Her electrocardiogram shows a sinus rhythm at a rate of 79.  There are  no significant ST changes noted.   DIAGNOSES:  1. Severe hypertension - the patient's blood pressure is now elevated      again.  We will begin to titrate her medications as tolerated.  We      will increase her clonidine to 0.3 mg p.o. b.i.d.  She will      continue to track her blood pressure at home and if it remains      elevated, we can increase her clonidine further versus increasing      her labetalol.  She has had an intolerance to ACE INHIBITORS in the      past secondary to cough.  She apparently is also had  an intolerance      to [facility], although she does not recall the details.  We will      check a BMET, follow potassium and renal function given her recent      renal insufficiency as well as resuming her ARB/diuretic.  I will      see her back in 6 weeks and we will review her blood pressures and      adjust her regimen as indicated.  2. Recent cerebrovascular accident - she will continue to Plavix and      follow up with Neurology.  3. Hyperlipidemia - she will continue on her statin.  4. History of depression.  5. Sleep apnea - she will continue on her CPAP.     Madolyn Frieze Jens Som, MD, [facility]  Electronically Signed    BSC/MedQ  DD: 09/17/2008  DT: 09/18/2008  Job #: [id number]   cc:   Lovenia Kim, D.O.

## 2011-03-22 NOTE — Assessment & Plan Note (Signed)
[facility]                            CARDIOLOGY OFFICE NOTE   NAME:[name], [name] BALLARD                        MRN:          [id number]  DATE:08/11/2008                            DOB:          03-Jan-1951    PRIMARY CARE PHYSICIAN:  [name] Kim, DO   REASON FOR PRESENTATION:  Evaluate the patient with difficult to control  hypertension.   The patient is 60 years old.  She presents for followup that was  previously scheduled, but coincidently was in the ER yesterday,  apparently with hypertension.  She states she was kept about 3 hours and  given IV labetalol.  I do not think her medications were changed.  Since  I saw her, she said that she had to come off the Cardura because she  felt spacey.  She said that she did well up until around September  when her blood pressure started creeping up.  She says her blood  pressure is not below 140/90 ever.  She reports one reading of 220/110  and frequent readings in the 170s to 180s with diastolics above 90s or  100s.  She still takes extra clonidine or Xanax when she has a blood  pressure spike.  She denies any new cardiovascular complaints such as  chest pressure, neck or arm discomfort.  She has had no new shortness of  breath, PND, or orthopnea.  She is also somewhere along the line stopped  taking Cardizem, though she does not recall why.  I had listed  previously, but it is not one of her meds now.   PAST MEDICAL HISTORY:  Depression, hypertension (of note, she did have  some elevated catecholamines, but negative  MIBG) tremor of unclear  etiology, questionable sleep apnea (she has CPAP, but she does not  wear), Stamey procedure, hysterectomy, left knee replacement.   ALLERGIES AND INTOLERANCES:  ACE INHIBITOR.   MEDICATIONS:  1. Wellbutrin 300 mg daily.  2. Exelon patch.  3. Labetalol 400 mg b.i.d.  4. Xanax 0.5 mg t.i.d.  5. Lasix 40 mg daily.  6. Clonidine 0.3 mg b.i.d.  7. Atacand 32/25  daily.  8. Protonix 40 mg daily.  9. Aspirin 325 mg daily.  10.Galantamine ER 16 mg daily.   REVIEW OF SYSTEMS:  Positive for headaches behind her ears.  Otherwise,  negative for all other systems.   PHYSICAL EXAMINATION:  GENERAL:  The patient is in no acute distress.  VITAL SIGNS:  Blood pressure 161/91, heart rate 81 and regular, weight  180 pounds, body mass index 33.  HEENT:  Eyes unremarkable.  Pupils equal, round, and reactive to light.  Fundi not visualized.  Oral mucosa unremarkable.  NECK:  No jugular venous distention at 45 degrees.  Carotid upstroke  brisk and symmetrical.  No bruits, no thyromegaly.  LYMPHATICS:  No cervical, axillary, or inguinal adenopathy.  LUNGS:  Clear to auscultation bilaterally.  BACK:  No costovertebral angle tenderness.  CHEST:  Unremarkable.  HEART:  PMI not displaced or sustained.  S1 and S2 within normal limits.  No S3, no S4.  No clicks, no rubs, no murmurs.  ABDOMEN:  Flat, positive  bowel sounds normal in frequency and pitch.  No bruits, no rebound, no  guarding.  No midline pulsatile mass.  No hepatomegaly, no splenomegaly.  SKIN:  No rashes, no nodules.  EXTREMITIES:  2+ pulses throughout.  No edema, no cyanosis, no clubbing.  NEUROLOGICAL:  She states there is resting tremor intermittently.  Cranial nerves are normal grossly.  Motor grossly intact.   EKG (done yesterday in the emergency room) sinus rhythm, right axis  deviation, borderline low voltage in the chest leads, nonspecific  anterior ST-T wave flattening.   ASSESSMENT AND PLAN:  1. Hypertension.  Blood pressure is still difficult to control.  At      this point, I am going to give her spironolactone.  Of note, her      potassium was 3.4 in the ER yesterday, so should not have      difficulty with hyperkalemia.  However, I told her that we need to      have this checked frequently.  We will a BMET in a month.  We will      need to do 3-4 times a year thereafter.  She will  start with 25 mg      once a day.  She will continue her other medicines as listed.  2. Questionable sleep apnea.  We discussed again the need to wear CPAP      if it was prescribed.  She does not think she is very compliant      with this and perhaps she can try.  3. Depression.  She has this treated.  4. Followup.  I will see her back in about 3 months or sooner if      needed.  Again, she understands the need for the blood work.     [name] Rotunda, MD, [facility]  Electronically Signed    JH/MedQ  DD: 08/11/2008  DT: 08/12/2008  Job #: [id number]   cc:   [name] [name], D.O.

## 2011-03-22 NOTE — Discharge Summary (Signed)
[name] [name], [name] [name]                 ACCOUNT NO.:  [id number]   MEDICAL RECORD NO.:  [id number]          PATIENT TYPE:  INP   LOCATION:  3023                         FACILITY:  [facility]   PHYSICIAN:  Marcellus Scott, MD     DATE OF BIRTH:  03-02-1951   DATE OF ADMISSION:  08/12/2008  DATE OF DISCHARGE:  08/18/2008                               DISCHARGE SUMMARY   PRIMARY CARE PHYSICIAN:  Lovenia Kim, D.O.   PRIMARY CARDIOLOGIST:  Rollene Rotunda, MD, [facility].   DISCHARGE DIAGNOSES:  1. Left subcortical infarct/cerebrovascular accident.  2. Hypertension.  3. Acute on chronic kidney disease.  4. Right shoulder pain.  5. Tremors.  6. Anxiety and depression.  7. Nausea and vomiting - resolved.  8. Anemia.  9. Constipation.  10.Question of sleep apnea.  11.Hypokalemia - repleted.   DISCHARGE MEDICATIONS:  1. Wellbutrin 300 mg p.o. daily.  2. Labetalol 400 mg p.o. b.i.d.  3. Xanax 0.5 mg tablet, one in the morning then one in the afternoon      then two at night.  4. Protonix 40 mg p.o. daily.  5. Galantamine ER 16 mg p.o. daily.  6. Plavix 75 mg p.o. daily.  7. Simvastatin 20 mg p.o. nightly.  8. Clonidine 0.2 mg p.o. b.i.d.  9. Senna 1 tablet p.o. b.i.d.  10.Thick-It instant food thickener p.o. p.r.n.   DISCONTINUED MEDICATIONS:  1. Lasix.  2. Atacand 16/12.5.  3. Aspirin 325.  4. Spironolactone 25 mg.   PROCEDURES:  1. Abdominal x-ray on October 10.  Impression:  No acute abdominal      findings.  2. Right shoulder x-ray on October 10.  Impression:  Right      acromioclavicular joint subluxation.  3. Modified barium swallow by speech therapy on the October 8.  4. MRI of the head without contrast.  Impression:  Background pattern      of extensive chronic small vessel disease.  Acute 1 cm white matter      infarction in the corona radiata on the left.  5. MRA of the head.  Impression:  Normal intracranial MRA of the large      and medium sized vessels.  6. CT of the  head without contrast on October 6.  Impression:  No      discreet acute abnormality.  Progressive chronic small vessel      ischemic changes.  Old lacunar infarct in the right thalamus.  7. Carotid Doppler on October 7.  Impression:  Vertebral artery flow      antegrade bilaterally.  No significant right or left internal      carotid artery stenosis.  8. The 2-D echocardiogram on October 7.  Impression:  Technically      limited study.  Overall, left ventricular systolic function was      normal.  The left ventricular ejection fraction estimated to be      55%.  The study was inadequate for evaluation of left ventricular      regional wall motion.  The left ventricular wall thickness was  moderately increased.  The right ventricular function could not be      assessed.   PERTINENT LABS:  Basic metabolic panel today remarkable for BUN 13,  creatinine 1.32.  CBC today with hemoglobin of 11.8, hematocrit 11.8,  hematocrit of 35, MCV 87, white blood cell 4.5, platelets 185.  Cardiac  panel cycled and negative x3.  Peak creatinine of 2.09 on October 8.  Homocystine 15.3.  TSH was 2.793.  Hemoglobin  A1c of 5.5.  Lipid panel  with cholesterol 165, triglycerides 162, HDL 28, LDL 105, VLDL 32.   CONSULTATIONS:  1. Cardiology, Rollene Rotunda, MD, [facility].  2. Neurology, Dr. Pearlean Brownie.  3. Orthopedics, Claude Manges. Cleophas Dunker, M.D.   HOSPITAL COURSE AND PATIENT DISPOSITION:  Please refer to the history  and physical note for initial admission details.  In summary, [name] [name]  is a 60 year old Caucasian female patient with history of difficult to  control hypertension as an outpatient, anxiety, depression, tremors,  gastroesophageal reflux disease who presented with history of facial  droop, bilateral headaches, right upper extremity weakness and numbness  on the afternoon of admission.  Evaluation in the emergency room  revealed blood pressure of 119/58 mmHg and features suggestive of CVA  with  right-sided hemiparesis.  The patient was then admitted to the  hospital for further evaluation and management.  She was beyond the  window for thrombolytic agents.  1. Acute left subcortical CVA probably secondary to chronic small      vessel disease and underlying uncontrolled hypertension.  The      patient was admitted to telemetry with no significant arrhythmia      alarms.  Extensive workup was done and the CVA was confirmed.  She      was initially placed on both aspirin and Plavix.  Neurology was      consulted.  They discontinued the aspirin and recommended      continuing Plavix alone.  They also recommended controlling her      secondary risk factors including hyperlipidemia and hypertension.      Speech therapy consultation was requested and her dietary      recommendations were made and the patient educated.  The patient      has also been enrolled in the IRIS trial by Dr. Marlis Edelson office.      Physical therapy, occupational therapy evaluated the patient and      recommended outpatient physical therapy and speech therapy follow      up.  The patient is to call Dr. Marlis Edelson office for an appointment      to follow up with him.  2. Hypertension.  The patient in the hospital was continued on all of      her home medications initially.  However, she became hypotensive      and complained of dizziness, nausea and vomiting.  She also had      elevation in her creatinine to 2.09.  Her diuretics were then      stopped.  She was briefly hydrated with IV fluids.  Despite this      the blood pressure continued to be soft in the 100s-110s systolic.      Following which her ARB was also held.  With these measures the      patient's creatinine has improved.  Unclear, what her baseline      creatinine is.  Today her blood pressure ranges between 120-136      systolic and 70-82 diastolic.  The  patient will be discharged home      on a reduced dose of clonidine, her prior home dose of  labetalol.      All of her diuretics have been discontinued and also her ARB.      Obviously, she was a difficult to control hypertensive prior to      admission and for all we know the blood pressure might creep up      again.  There by, the patient is to follow up with her primary      medical doctor's office in the next 5-7 days for repeat blood      pressure check and monitoring of her basic metabolic panel.  She      has also been advised to seek attention if her home blood pressure      checks reveal blood pressure greater than 160/100.  The patient      verbalizes understanding of these instructions.  3. Renal insufficiency.  Question baseline creatinine at home.  Follow      up basic metabolic panel as outpatient.  4. Right shoulder pain which the patient says was new onset in the      hospital when she had difficulty elevating the right shoulder.  She      denied any history of trauma prior to hospitalization.  Chest x-ray      was suggestive of acromioclavicular joint subluxation.  Orthopedic      consult was requested.  Claude Manges. Cleophas Dunker, M.D. indicated that the      right shoulder pain could be multiple etiologies including adhesive      capsulitis, rotator cuff tear, rotator cuff tendinitis, but he      indicates that she is actually improving over the last 24-36 hours.      He suggested physical therapy for range of motion and hold further      diagnostic workup for several weeks to monitor her course and then      evaluate her as an outpatient.  If she continued to have pain MRI      of the right shoulder could be considered as an outpatient.  5. Constipation which had been an ongoing problem and improved with      laxatives.  6. Question sleep apnea for outpatient evaluation as deemed necessary.   At this time, the patient is stable and will be discharged home with  outpatient physical therapy, speech therapy and a tub seat.      Marcellus Scott, MD  Electronically  Signed     AH/MEDQ  D:  08/18/2008  T:  08/18/2008  Job:  [id number]   cc:   Lovenia Kim, D.O.  Rollene Rotunda, MD, [facility], Dr.  Claude Manges. Cleophas Dunker, M.D.

## 2011-03-22 NOTE — Assessment & Plan Note (Signed)
[facility]                            CARDIOLOGY OFFICE NOTE   NAME:[name] [name], [name] [name]                        MRN:          [id number]  DATE:08/28/2007                            DOB:          11/12/1950    PRIMARY CARE PHYSICIAN:  Dr. Lovenia Kim.   REASON FOR PRESENTATION:  Evaluate patient with difficult-to-control  hypertension.   HISTORY OF PRESENT ILLNESS:  The patient returns for followup of the  above.  She is now 60 years old.  She has been on the medicines listed  below and has had good blood pressure control, per her report.  She does  take her blood pressure  at home periodically and it is usually below  140/90.  She has had no new cardiovascular complaints.  She is limited  somewhat by her tremor.  She is on disability for this and other  problems.  With her activities of daily living, however, she does not  have any cardiovascular complains such as chest discomfort, neck or arm  discomfort.  She has no new shortness of breath and denies any PND or  orthopnea.  She is not having any palpitations, pre-syncope or syncope.   PAST MEDICAL HISTORY:  1. Depression.  2. Hypertension.  3. Tremor, of unclear etiology.  4. Questionable sleep apnea.  5. Left knee replacement.  6. Hysterectomy.  7. Stamey procedure.   ALLERGIES:  ACE INHIBITOR.   CURRENT MEDICATIONS:  1. Wellbutrin 300 mg daily.  2. Labetalol 200 mg q.12h.  3. Exelon patch.  4. Clonidine 0.3 mg twice daily.  5. Atacand/hydrochlorothiazide 16/12.5 mg twice daily.  6. Cardizem 120 mg twice daily.  7. Lasix 40 mg daily.  8. Nexium 40 mg daily.  9. Aspirin 325 mg daily.   REVIEW OF SYSTEMS:  As stated in the HPI and otherwise negative for  other systems.   PHYSICAL EXAMINATION:  GENERAL:  The patient is in no acute distress.  VITAL SIGNS:  Weight 181 pounds, body mass index 32.  HEENT:  Eyes unremarkable.  Pupils equal, round, reactive to light.  Fundi not  visualized.  NECK:  No jugular venous distention at 45 degrees.  Carotid upstroke  brisk and symmetric.  No bruits, no thyromegaly.  LYMPHATICS:  No cervical, axillary or inguinal adenopathy.  LUNGS:  Clear to auscultation bilaterally.  BACK:  No costovertebral angle tenderness.  CHEST:  Unremarkable.  HEART:  PMI not displaced or sustained.  S1 and S2 within normal limits.  No S3, no S4, no clicks, rubs or murmurs.  ABDOMEN:  Mildly obese.  Positive bowel sounds.  Normal in frequency and  pitch.  No bruits, no rebound, no guarding, no midline pulsatile mass,  no hepatomegaly, no splenomegaly.  SKIN:  No rashes.  EXTREMITIES:  With 2+ pulses throughout.  No clubbing, cyanosis or  edema.  NEUROLOGIC:  The patient has a resting tremor.  Otherwise intact.  Cranial nerves intact.  Motor grossly intact.   Electrocardiogram:  Sinus rhythm, rate of 75, right axis deviation, no  acute ST-T wave changes.   ASSESSMENT/PLAN:  1. Hypertension:  The patient's blood pressure is actually well-      controlled on a combination therapy.  At this point, she will      continue this regimen and home monitoring three or four times a      week.  She will let me know if she has any acceleration of her      blood pressure, in which case I would re-evaluate for medical      management.  2. Dyspnea:  The patient describes some mild dyspnea with exertion.      This seems to be a chronic pattern.  She has had a negative workup,      including stress perfusion study and echocardiogram.  No further      workup is planned at this point.   FOLLOWUP:  I will see the patient again in one year, per her request, or  sooner if needed.     Rollene Rotunda, MD, [facility]  Electronically Signed    JH/MedQ  DD: 08/28/2007  DT: 08/29/2007  Job #: [id number]   cc:   Lovenia Kim, D.O.

## 2011-03-22 NOTE — Consult Note (Signed)
[name], [name]                 ACCOUNT NO.:  [id number]   MEDICAL RECORD NO.:  [id number]          PATIENT TYPE:  INP   LOCATION:  3023                         FACILITY:  [facility]   PHYSICIAN:  Claude Manges. Whitfield, M.D.DATE OF BIRTH:  1951-04-24   DATE OF CONSULTATION:  DATE OF DISCHARGE:                                 CONSULTATION   CHIEF COMPLAINT:  Right shoulder pain.   HISTORY:  This 60 year old female was admitted approximately 5 days ago,  was status post nonembolic CVA.  She has developed right shoulder pain  with limited overhead motion.  Accordingly, we have been asked to  evaluate.   [name] relates that she has had some trouble off and on since 2005  when she had initial CVA.  Since then, she has had problems with her  balance and has had frequent falls, but she does not remember specific  injury or trauma either in the past or even more recently, but over the  past 24-36 hours she has had improved motion and less pain.  Films have  been performed that demonstrated type 2 AC joint separation without  other obvious pathology.   On examination of right shoulder, she had mild prominence of the [facility]  joint with minimal pain.  She had a painful arc with near full overhead  motion.  Shoulder was located.  Impingement was negative.  She has  experienced some numbness and tingling in her radial 3 digits.   I did review the films, there is no evidence of abnormality of the  glenohumeral joint.  There is elevated distal clavicle in relationship  to the acromion type 2, its interesting that she really is not that  tender.  I wonder if this may be old.   IMPRESSION:  Right shoulder pain could be multiple etiologies including  really adhesive capsulitis, rotator cuff tear, rotator cuff tendonitis.  She is actually better over the last 24-36 hours.  I would suggest a  course of physical therapy for a range of motion and hold off on further  diagnostic studies for several weeks  to monitor her course and then  evaluate her as an outpatient.  If she continues to have pain, then I  think we should consider an MRI scan.      Claude Manges. Cleophas Dunker, M.D.  Electronically Signed     PWW/MEDQ  D:  08/17/2008  T:  08/18/2008  Job:  [id number]

## 2011-03-22 NOTE — Assessment & Plan Note (Signed)
[name]                            CARDIOLOGY OFFICE NOTE   NAME:[name] [name], [name] [name]                        MRN:          [id number]  DATE:11/12/2008                            DOB:          August 07, 1951    [name] [name] is a 60 year old female who I have began seen for severe  hypertension.  She was previously followed by Dr. Antoine Poche.  She also  has hyperlipidemia.  When I last saw on September 17, 2008, we increased  her clonidine to 0.3 mg p.o. b.i.d.  Since that time, she denies any  increased dyspnea.  She had dyspnea with more extreme activities, but  not with routine activities.  There is no orthopnea, PND, pedal edema,  palpitations, presyncope, syncope, or chest pain.   MEDICATIONS:  1. Atacand HCT 16/12.5 mg p.o. b.i.d.  2. Clonidine 0.3 mg p.o. b.i.d.  3. Wellbutrin 300 mg p.o. daily.  4. Labetalol 400 mg p.o. b.i.d.  5. Protonix 40 mg p.o. daily.  6. Plavix 75 mg p.o. daily.  7. Zocor 20 mg p.o. daily.  8. Galantamine.   PHYSICAL EXAMINATION:  VITAL SIGNS:  Blood pressure of 130/100.  Pulse  is 56.  She weighs 176 pounds.  HEENT:  Normal.  NECK:  Supple.  CHEST:  Clear.  CARDIOVASCULAR:  Regular rate and rhythm.  ABDOMEN:  No tenderness.  EXTREMITIES:  No edema.   DIAGNOSES:  1. Severe hypertension - [name] [name]'s blood pressure remains elevated.      She will continue on her Atacand HCT, clonidine, and labetalol.  We      will add Cardizem 240 mg p.o. daily.  Note, she had been on      Cardizem in the past with the above medications and her  blood      pressure, apparently, was reasonably well controlled.  We will      track her blood pressure and increase her medications as indicated.      Note, she has had a cough to angiotensin-converting enzyme      inhibitors in the past and she apparently did not tolerate Norvasc.      We will check a BMET to follow her potassium and renal function.  2. Hyperlipidemia - She will continue her  statin.  We will check      lipids and liver and adjust as indicated.  3. History of cerebrovascular accident - she will continue her Plavix.  4. History of depression.  5. Sleep apnea.   We will see her back in 3 months.     Madolyn Frieze Jens Som, MD, [facility]  Electronically Signed    BSC/MedQ  DD: 11/12/2008  DT: 11/13/2008  Job #: [id number]   cc:   Lovenia Kim, D.O.

## 2011-03-25 NOTE — Assessment & Plan Note (Signed)
[facility]                              CARDIOLOGY OFFICE NOTE   NAME:[name], [name]                        MRN:          [id number]  DATE:06/22/2006                            DOB:          06-Oct-1951    The primary is Lovenia Kim, DO   REASON FOR PRESENTATION:  Patient with difficult-to-control hypertension.   HISTORY OF PRESENT ILLNESS:  The patient returns for yearly follow-up.  Her  blood pressure has been well-controlled on the combination of medications as  listed.  She has had no cardiac problems since I last saw her.  She denies  any chest discomfort, neck discomfort or arm discomfort.  She is not  noticing significant palpitations, has no PND or orthopnea.   PAST MEDICAL HISTORY:  1. Depression.  2. Hypertension.  3. Tremor of unclear etiology.  4. Questionable sleep apnea.  5. Left knee replacement.  6. Hysterectomy.  7. Stamey procedure.   ALLERGIES:  ACE INHIBITORS.   MEDICATIONS:  1. Clonidine 0.2 mg b.i.d.  2. Atacand 16/12.5 mg b.i.d.  3. Cardizem 120 mg b.i.d.  4. Lasix 40 mg daily.  5. Nexium.  6. Aspirin 325 mg daily.  7. Remeron.  8. Wellbutrin 150 mg q.i.d.  9. Labetalol 400 mg b.i.d.  10.Alprazolam.  11.Ambien.  12.Stool softener.   REVIEW OF SYSTEMS:  As stated in the HPI, otherwise negative for other  systems.   PHYSICAL EXAMINATION:  GENERAL:  The patient is in no distress.  VITAL SIGNS:  Blood pressure 98/62, heart rate 64 and regular, weight 181  pounds.  NECK:  No jugular venous distention.  Wave form within normal limits.  Carotid upstroke brisk and symmetric.  No bruits, no thyromegaly.  LYMPHATIC:  No adenopathy.  LUNGS:  Clear to auscultation bilaterally.  BACK:  No costovertebral angle tenderness.  CHEST:  Unremarkable.  CARDIAC:  PMI not displaced or sustained.  S1 and S2 within normal limits.  No S3, no S4, no murmurs.  ABDOMEN:  Flat, positive bowel sounds, normal in frequency and  pitch, no  bruits, no rebound, no guarding, no midline pulsatile mass, no organomegaly.  SKIN:  No rashes, no nodules.  EXTREMITIES:  2+ pulses, no edema.   EKG:  Sinus rhythm, rate 64, axis rightward (question lead placement), no  acute ST-T wave changes.   ASSESSMENT AND PLAN:  Hypertension.  The patient's blood pressure is well-  controlled.  At this point no further cardiovascular testing is suggested.  She will continue with this regimen.   FOLLOW-UP:  I will see her yearly.                               Rollene Rotunda, MD, [facility]    JH/MedQ  DD:  06/22/2006  DT:  06/23/2006  Job #:  [id number]   cc:   Lovenia Kim, DO

## 2011-03-25 NOTE — Consult Note (Signed)
[place]  Patient:    [name] [name]                      MRN: [id number] Adm. Date:  [id number] Attending:  Ammie Dalton CC:         Ammie Dalton, M.D.   Consultation Report  REFERRING PHYSICIAN:  Ammie Dalton, M.D.  INDICATION FOR CONSULTATION:  Has hypertension, tachybrady syndrome.  HISTORY:  [name] is a 59 year old female who has been followed by Dr. Theda [name] for hypertension.  The patient had been previously placed on Norvasc and Diovan for approximately two years.  [name] noted increasing flushing sensations.  This was attributed to the Norvasc, which was subsequently discontinued.  The patient noted improvement in the flushing sensation over a period of 2-3 days.  She was subsequent started on Ziac and Diovan.  With this regimen she noted increased fatigue and a significant drop in her blood pressure.  The Ziac was subsequently discontinued and the patient noted increase in heart rate and tremors.  She also notes a 4-5 day history of headaches, which radiate to her right posterior neck.  She has had increasing complaints of palpitations and exercise intolerance.  CORONARY RISK FACTORS:  The patient is a nonsmoker.  Family history is significant for father having myocardial infarction in his 26s.  Cholesterol is reportedly normal.  She is not involved in an exercise program and she is not post menopausal.  CURRENT MEDICATIONS AT TIME OF ADMISSION: 1. Diovan 160. 2. Hydrochlorothiazide 25 mg. 3. Corgard, dose unknown. 4. Xanax p.r.n. 5. Advil p.r.n.  PAST MEDICAL HISTORY: 1. Hypertension. 2. The pheochromocytoma workup has been performed and is reportedly negative. 3. Palpitation. 4. Thyroid function test has been performed and is reportedly negative.  PAST SURGICAL HISTORY:  Knee fracture on the left, hysterectomy, appendectomy, and bladder surgery.  ALLERGIES:  NORVASC, which results in a flushing  sensation.  REVIEW OF SYSTEMS:  Increase in tremors, as noted above.  Increase in weight gain of approximately 30 pounds.  Increase in arthritic pain.  Occasional gastritis, for which she takes Prilosec - currently she is not on any.  She has been taking Advil occasionally for her headaches.  Has had increasing sleep difficulty.  Has had no problems with her urinary system.  Has had no bleeding disorder, anemia, and denies shortness of breath.  SOCIAL HISTORY:  The patient is married, has two children ages 20 and 56.  Her 60 year old daughter has bipolar disorder.  This appears to be controlled at this point.  Describes her work relationship as good.  FAMILY HISTORY:  Significant for coronary artery disease, as noted above; COPD; cerebrovascular accident in both maternal and paternal grandparents.  PHYSICAL EXAMINATION:  GENERAL:  Reveals a pleasant, middle-aged female in no acute distress, sitting upright in bed eating dinner.  VITAL SIGNS:  Blood pressure is now 140/74, heart rate has ranged 56-70. Telemetry has not revealed any arrhythmias.  She is afebrile.  HEENT:  Unremarkable.  No neck vein distention, no carotid bruits, good carotid upstroke.  Thyroid is not palpable.  There is no evidence of xanthelasma.  PULMONARY:  Reveals breath sounds which are equal and clear to auscultation.  CARDIOVASCULAR:  Reveals a normal S1, normal S2, regular rate and rhythm. There are no rubs, murmurs, or gallops noted.  The PMI is within normal limits.  ABDOMEN:  Soft and nontender, no unusual pulsations or bruits are noted. Liver is not palpable.  EXTREMITIES:  Reveal no clubbing, cyanosis, or edema.  There is a tremor noted on her right hand.  LABORATORY DATA:   Electrolytes were reviewed and were found to be within normal limits.  Normal TSH.  Normal pheochromocytoma workup.  IMPRESSION:  Hypertension with difficult to control.  Most of her symptoms can now be explained by a  possible beta blocker withdrawal.  Would initiate Toprol at 25 mg p.o. q.d., as this is better tolerated than most beta blockers. Monitor for increasing fatigue.  If this should happen, would slowly wean the Toprol and add Diovan, as the patient has appeared to tolerate this in the past.  If the tremors do not improve with beta blocker, may want to consider neurology evaluation.  At this point, there is no indication for further hypertension evaluation. Adjustments will be made according to patients progress.  I have also encouraged the patient to initiate exercise program and that her weight may be a contributing factor to her difficult-to-control blood pressure. Additionally, the patient should avoid NSAIDS, as this may make her blood pressure more difficult to control.  If her telemetry remains uneventful with initiation of beta blocker, we may follow this patient as an outpatient, if her CT scan proves to be negative. DD:  08/09/00 TD:  08/10/00 Job: [id number]

## 2011-03-25 NOTE — Discharge Summary (Signed)
[name], [name]                           ACCOUNT NO.:  [id number]   MEDICAL RECORD NO.:  [id number]                   PATIENT TYPE:  INP   LOCATION:  5024                                 FACILITY:  [facility]   PHYSICIAN:  Robert A. Thurston Hole, M.D.              DATE OF BIRTH:  [birth year]   DATE OF ADMISSION:  05/07/2003  DATE OF DISCHARGE:  05/12/2003                                 DISCHARGE SUMMARY   ADMITTING DIAGNOSES:  1. End-stage degenerative joint disease, left knee.  2. Gastroesophageal reflux disease.  3. Hypertension.  4. Coronary artery disease.  5. History of mini strokes.   DISCHARGE DIAGNOSES:  1. End-stage degenerative joint disease, left knee.  2. Hypertension.  3. Gastroesophageal reflux disease.  4. Coronary artery disease.  5. History of mini strokes.  6. Urinary tract infection.   HISTORY OF PRESENT ILLNESS:  The patient is a 60 year old female who has end-  stage DJD of the left knee.  She has tried conservative care, including anti-  inflammatories, Cortizone injections, and orthoscopic debridement all  without success.  At this point in time, she has pain with ambulation and  pain at rest, unrelieved by any conservative care.  She understands the  risks, benefits and possible complications of a left total knee replacement,  and is without question.   PROCEDURES IN-HOUSE:  On May 07, 2003, she underwent a left total knee by  Dr. Thurston Hole.  She also underwent a left knee femoral nerve block by  anesthesia.  She tolerated both procedures well.   Postoperatively, she was admitted for pain control and deep vein thrombosis  prophylaxis.  She was having breakthrough pain and her PCA was increased due  to her breakthrough pain.  On postoperative day one, her drain was DC'd, her  hemoglobin was 10.2, her B-MET was within normal limits, she began to  mobilize.  On postoperative day one, the patient had difficulty voiding, the  catheter was reinserted.  She  progressed well with physical therapy.  On  postoperative day two, white cell count was 13,000, hemoglobin was 9.  She  was metabolically stable.  The surgical wound was well approximated.  She  was given Diflucan for a possible yeast infection.  She was also started on  Urecholine 25 mg, one p.o. t.i.d. for her urinary retention.  Discharge  plans were made for day three.  On postoperative day three, the patient was  doing well except for difficulty with urinary retention and a UTI.  Her  hemoglobin was 8.7, her INR was 3.8, and her sodium was 130.  Otherwise, she  was metabolically stable.  On postoperative day four, the patient was doing  well except for itching from her Percocet.  Her Percocet was DC'd and she  was started on Dilaudid for pain.  Her hemoglobin was 7.5, she was  transfused 2 units.  On postoperative day  five, hemoglobin was 10.8.  Her chest x-ray was negative, her INR was 2.6,  she was ambulating well, she was discharged to home on Dilaudid, Coumadin,  Ambien and Cipro for a UTI.  She will follow up with our office on May 19, 2003.  She is weightbearing as tolerated on a regular diet.      Kirstin Shepperson, P.A.                  Robert A. Thurston Hole, M.D.    KS/MEDQ  D:  06/04/2003  T:  06/05/2003  Job:  [id number]

## 2011-03-25 NOTE — Discharge Summary (Signed)
[place]. [facility]  Patient:    [name] [name], [name] [name]                      MRN: [id number] Adm. Date:  [id number] Disc. Date: [id number] Attending:  Ammie Dalton                           Discharge Summary  DISCHARGE DIAGNOSES: 1. Tachycardia-bradycardia by complaints but not documented on telemetry. 2. Difficult to control hypertension. 3. Migraine, atypical, likely related to the hypertension. 4. Tremor, acute on chronic onset. 5. Mild anxiety. 6. Norvasc reaction of flushing.  HOSPITAL COURSE:  The patient was admitted for further evaluation of difficult to control hypertension and control of migrainous headache and question of tachycardia-bradycardia from reaction to medications, especially beta blocker. She better tolerated adding Toprol but still had a lot of fatigue and side effects of it.  She saw Dr. Fraser Din while she was in the hospital and her telemetry did not delineate any further problems.  Her enzymes were normal. Her tremor did go down more to baseline but did not resolve.  DISCHARGE PLAN:  The patient is discharged to home with continuation of her Diovan and the addition of Toprol XL 25 mg p.o. q.a.m.  She will not continue on the Corgard or the Norvasc in the past.  She will follow up with me in one week.  I will arrange as an outpatient for her to see Dr. Garnette Scheuermann at her request for hypertension and tachycardia.  We will watch her closely for further bradycardia.  If the tremor continues or worsens, I will obtain a neurology work-up as an outpatient. DD:  09/06/00 TD:  09/07/00 Job: [id number]

## 2011-03-25 NOTE — Procedures (Signed)
[facility]  Patient:    [name] [name], [name] [name]                          MRN: [id number] Proc. Date: 09/11/00 Attending:  Thyra Breed, M.D. CC:         Marlan Palau, M.D.  Ammie Dalton, M.D.   Procedure Report  DATE OF BIRTH:  Jul 14, 1951  DIAGNOSIS:  Postural puncture headache.  PROCEDURE:  Epidural blood patch.  HISTORY OF PRESENT ILLNESS:  The patient underwent a lumbar spinal tap on Friday and by Saturday afternoon had severe frontal headaches which were exacerbated by sitting upright. She was being evaluated for right upper extremity tremor.  She presents today for epidural blood patch at Dr. Anne Hahn request.  PHYSICAL EXAMINATION:  VITAL SIGNS:  Blood pressure is 145/72, heart rate 62, respiratory rate 18, O2 saturations 98%, temperature is 97.4.  GENERAL:  She is an uncomfortable appearing female who looks like she is in considerable discomfort overall.  NEUROLOGIC:  Significant for tremor of right upper extremity.  I discussed extensively the potential risks, benefits, and limitations of a lumbar epidural blood patch. The patient wishes to proceed with this.  DESCRIPTION OF PROCEDURE:  After informed consent was obtained, the patient was placed in the sitting position and monitored. Her back was prepped with Betadine x 3 and a skin wheal raised at the L2-3 insterspace with 1% lidocaine. An 18 gauge Hustead needle was introduced to the lumbar epidural space with loss of resistance to preservative free normal saline. Then 15 ml of blood was obtained after sterile prep of the right antecubital fossa with Betadine and injected into the epidural space. The needle was flushed with preservative free normal saline and removed intact.  CONDITION POST PROCEDURE:  The patient noted on injection some discomfort out into her right hip which after about 3 or 4 minutes was resolving.  DISPOSITION: 1. The patient was advised that she  needs to consume copious amounts of    fluid up to 2-3 two liter bottles of a carbonated caffeinated beverage    per day for the next 3 days to reconstitute her cerebral spinal fluid. 2. Continue on current medications. 3. Follow-up with Dr. Anne Hahn. DD:  09/11/00 TD:  09/12/00 Job: [id number]

## 2011-03-25 NOTE — Procedures (Signed)
[place]. [facility]  Patient:    [name] [name], [name] [name]                      MRN: [id number] Proc. Date: 09/08/00 Adm. Date:  [id number] Attending:  Lesly Dukes CC:         [facility]   Procedure Report  PROCEDURE:  SURGEON:  Marlan Palau, M.D.  INDICATIONS:  This is a 60 year old patient with new onset tremor, abnormal MRI scan of the brain.  The patient is being evaluated for possible MS or vascular ______.  DESCRIPTION OF PROCEDURE:  Lumbar puncture was performed with the patient in the fetal position on the left side.  The lower back was cleaned with Betadine solution and approximately 2 cc of 1% Xylocaine was used as a local anesthetic.  A 20-gauge spinal needle was inserted into the L3-4 interspace and approximately 16 cc of clear colorless spinal fluid was removed for testing.  The opening pressure was 90 mmH2O.  Tube #1 was sent for VDRL, Cryptococcal antigen.  Tube #2 was sent for oligoclonal banding, IgG, ____. Tube #3 was sent for cell differential, glucose, protein.  Tube #4 was sent for Lymes panel.  There were no complications of the above procedures noted.  The patient tolerated the procedure well. DD:  09/08/00 TD:  09/08/00 Job: [id number]

## 2011-03-25 NOTE — Op Note (Signed)
[name] [name], [name] [name]                           ACCOUNT NO.:  [id number]   MEDICAL RECORD NO.:  [id number]                   PATIENT TYPE:  INP   LOCATION:  2550                                 FACILITY:  [facility]   PHYSICIAN:  Robert A. Thurston Hole, M.D.              DATE OF BIRTH:  1951/08/30   DATE OF PROCEDURE:  05/07/2003  DATE OF DISCHARGE:                                 OPERATIVE REPORT   PREOPERATIVE DIAGNOSIS:  Left knee degenerative joint disease.   POSTOPERATIVE DIAGNOSIS:  Left knee degenerative joint disease.   PROCEDURE:  Left total knee replacement using [employer] Scorpio Total Knee  System with femoral component #7 cemented, tibial component #5 cemented with  12 mm polyethylene flex tibial spacer.  Patella component a 26 mm  polyethylene cemented.   SURGEON:  Elana Alm. Thurston Hole, M.D.   ASSISTANT:  Julien Girt, P.A.   ANESTHESIA:  General anesthesia.   OPERATIVE TIME:  One hour and 20 minutes.   COMPLICATIONS:  None.   DESCRIPTION OF PROCEDURE:  Ms. Krock is brought to the operating room on  May 07, 2003, placed on the operating table in the supine position.  After  an adequate level of general anesthesia was obtained, her left knee was  examined under anesthesia.  She had range of motion from 0 to 125 degrees,  mild varus deformity.  Knee stable ligamentous examination with normal  patella tracking.  She had a Foley catheter placed under sterile conditions  and received Ancef 2 g IV preoperatively for prophylaxis as well as Cipro  400 mg IV for a previous urinary tract infection.  Her left leg was prepped  using sterile DuraPrep and draped using sterile technique.  The leg was  exsanguinated and a thigh tourniquet elevated 375 mm.  Initially, through a  15 cm longitudinal incision based over the patella, initial exposure was  made. The underlying subcutaneous tissues were incised along with skin  incision.  A median arthrotomy was performed revealing an  excessive amount  of normal-appearing joint fluid.  The articular surfaces were inspected.  She had grade IV changes medially, grade III changes laterally, and grade  III and IV changes in the patellofemoral joint.  Osteophytes were removed  from the femoral condyles and tibial plateau.  The medial and lateral  meniscal remnants were removed as well as the anterior cruciate ligament.  An intramedullary drill was then drilled up the femoral canal for placement  of the distal femoral cutting jig which was placed through the appropriate  amount of rotation and the distal 12 mm cut was made.  The distal femur was  then sized.  A #7 was found to be appropriate size and a #7 cutting jig was  placed and then these cuts were made.  The proximal tibia was then exposed.  The tibial spines were removed with an oscillating saw.  The intramedullary  drill  drilled down the tibial canal for placement of the proximal tibial  cutting jig which was placed in the appropriate amount of rotation and a 4  mm proximal tibial cut was made.  After this was done, then the Scorpio PCL  cutter was placed back on the distal femur and these cuts were made.  After  this was done, then the #7 femoral trial was placed, the #5 tibial baseplate  trial was placed, as well as 12 mm polyethylene spacer.  There was found to  be excellent restoration of normal stability and normal alignment.  Range of  motion 0 to 125 degrees with no lift off on the tray.  The tibial base plate  was then marked for rotation and the keel cut was made.  After this was  done, the patella was sized.  A 26 mm was found to be the appropriate size  and a recessed 10 mm x 26 mm cut was made and three locking holes were  placed.  After this was done, it was felt that all the trial components were  of excellent size, fit and stability and they were removed.  The knee was  then jet lavage irrigated with three liters of saline solution.  The  proximal tibia  was then exposed and the #5 tibial base plate with cement  backing was hammered into position with an excellent fit and with excess  cement being removed from around the edges.  The #7 femoral component with  cement backing was hammered into position also with an excellent fit with  excess cement being removed from around the edges.  The 12 mm polyethylene  flex tibial spacer was locked on the tibial base plate.  The knee taken  through range of motion 0 to 120 degrees with excellent stability. The 26 mm  polyethylene cement backed patella was then locked into its recessed hole  and held there with a clamp.  After the cement hardened, patellofemoral  tracking was evaluated and this was found to be normal. At this point, it  was felt that all the components were of excellent size, fit and stability.  The knee was further irrigated with saline and then the arthrotomy was  closed with #1 Ethibond suture over two medium Hemovac drains.  Subcutaneous  tissues closed with 0 and 2-0 Vicryl.  Skin closed with skin staples.  Sterile dressings were applied.  The Hemovac injected with 0.25% Marcaine  with epinephrine and clamped.  Tourniquet was released.  The patient then  had a femoral nerve block placed by anesthesia  for postoperative pain  control.  She was then awakened, extubated and taken to the recovery room in  stable condition.  Needle and sponge counts correct x2 at the end of the  case.                                               Robert A. Thurston Hole, M.D.    RAW/MEDQ  D:  05/07/2003  T:  05/07/2003  Job:  [id number]

## 2011-06-22 ENCOUNTER — Encounter: Payer: Self-pay | Admitting: Cardiology

## 2011-08-09 LAB — TROPONIN I: Troponin I: 0.01

## 2011-08-09 LAB — BASIC METABOLIC PANEL
BUN: 13
BUN: 18
BUN: 19
BUN: 21
CO2: 25
CO2: 27
Calcium: 8.9
Calcium: 9.2
Calcium: 9.3
Calcium: 9.4
Creatinine, Ser: 1.24 — ABNORMAL HIGH
Creatinine, Ser: 1.32 — ABNORMAL HIGH
Creatinine, Ser: 1.46 — ABNORMAL HIGH
Creatinine, Ser: 1.51 — ABNORMAL HIGH
Creatinine, Ser: 2.09 — ABNORMAL HIGH
GFR calc Af Amer: 43 — ABNORMAL LOW
GFR calc Af Amer: 45 — ABNORMAL LOW
GFR calc non Af Amer: 24 — ABNORMAL LOW
GFR calc non Af Amer: 37 — ABNORMAL LOW
GFR calc non Af Amer: 41 — ABNORMAL LOW
GFR calc non Af Amer: 45 — ABNORMAL LOW
Glucose, Bld: 102 — ABNORMAL HIGH
Glucose, Bld: 110 — ABNORMAL HIGH
Glucose, Bld: 86
Potassium: 3.7
Sodium: 132 — ABNORMAL LOW
Sodium: 136

## 2011-08-09 LAB — COMPREHENSIVE METABOLIC PANEL
ALT: 22
ALT: 23
AST: 24
AST: 24
Albumin: 3.3 — ABNORMAL LOW
Albumin: 3.6
Alkaline Phosphatase: 78
Alkaline Phosphatase: 82
BUN: 13
CO2: 26
Calcium: 9.4
Chloride: 104
Chloride: 106
Creatinine, Ser: 1.36 — ABNORMAL HIGH
GFR calc Af Amer: 49 — ABNORMAL LOW
GFR calc Af Amer: 54 — ABNORMAL LOW
GFR calc non Af Amer: 40 — ABNORMAL LOW
Glucose, Bld: 124 — ABNORMAL HIGH
Potassium: 3.3 — ABNORMAL LOW
Potassium: 3.5
Sodium: 138
Total Bilirubin: 0.5
Total Bilirubin: 0.6
Total Protein: 6.4

## 2011-08-09 LAB — CBC
HCT: 40.8
Hemoglobin: 13.7
MCHC: 33.5
MCV: 87.1
Platelets: 185
Platelets: 254
RBC: 4.68
RDW: 13.4
RDW: 14.2
WBC: 4.5
WBC: 6.2

## 2011-08-09 LAB — POCT I-STAT, CHEM 8
HCT: 43
Hemoglobin: 14.6
Potassium: 3.4 — ABNORMAL LOW
Sodium: 139
TCO2: 24

## 2011-08-09 LAB — DIFFERENTIAL
Basophils Absolute: 0
Basophils Relative: 0
Eosinophils Absolute: 0.4
Eosinophils Relative: 6 — ABNORMAL HIGH
Lymphocytes Relative: 32
Lymphs Abs: 2
Monocytes Absolute: 0.4
Monocytes Relative: 6
Neutro Abs: 3.5
Neutrophils Relative %: 56

## 2011-08-09 LAB — LIPID PANEL
LDL Cholesterol: 105 — ABNORMAL HIGH
Total CHOL/HDL Ratio: 5.9
Triglycerides: 162 — ABNORMAL HIGH
VLDL: 32

## 2011-08-09 LAB — APTT: aPTT: 27

## 2011-08-09 LAB — PROTIME-INR
INR: 1
Prothrombin Time: 13.4

## 2011-08-09 LAB — CARDIAC PANEL(CRET KIN+CKTOT+MB+TROPI)
Relative Index: INVALID
Total CK: 52
Total CK: 53
Total CK: 53
Troponin I: 0.01

## 2011-08-09 LAB — CK TOTAL AND CKMB (NOT AT ARMC)
CK, MB: 1.3
Relative Index: INVALID
Total CK: 98

## 2011-08-09 LAB — GLUCOSE, CAPILLARY: Glucose-Capillary: 127 — ABNORMAL HIGH

## 2011-08-17 ENCOUNTER — Other Ambulatory Visit (HOSPITAL_COMMUNITY): Payer: Self-pay | Admitting: Internal Medicine

## 2011-08-17 ENCOUNTER — Ambulatory Visit (HOSPITAL_COMMUNITY)
Admission: RE | Admit: 2011-08-17 | Discharge: 2011-08-17 | Disposition: A | Discharge status: Home / Self Care | Payer: BC Managed Care – PPO | Source: Ambulatory Visit | Attending: Internal Medicine | Admitting: Internal Medicine

## 2011-08-17 DIAGNOSIS — I10 Essential (primary) hypertension: Secondary | ICD-10-CM | POA: Insufficient documentation

## 2011-08-17 DIAGNOSIS — I517 Cardiomegaly: Secondary | ICD-10-CM | POA: Insufficient documentation

## 2011-08-17 DIAGNOSIS — R0602 Shortness of breath: Secondary | ICD-10-CM

## 2011-08-25 ENCOUNTER — Telehealth: Payer: Self-pay | Admitting: Women's Health

## 2011-08-25 LAB — COMPREHENSIVE METABOLIC PANEL
ALT: 16
Albumin: 3.5
Alkaline Phosphatase: 109
BUN: 17
Chloride: 99
Glucose, Bld: 97
Potassium: 3 — ABNORMAL LOW
Sodium: 138
Total Bilirubin: 0.7
Total Protein: 6.9

## 2011-08-25 LAB — POCT CARDIAC MARKERS
CKMB, poc: <1 — ABNORMAL LOW
Myoglobin, poc: 95.8
Operator id: 257131

## 2011-08-25 LAB — CBC
HCT: 37.1
Hemoglobin: 12.6
RDW: 14.1 — ABNORMAL HIGH
WBC: 10.9 — ABNORMAL HIGH

## 2011-08-25 LAB — DIFFERENTIAL
Basophils Absolute: 0.1
Basophils Relative: 1
Eosinophils Absolute: 0.1
Monocytes Absolute: 0.9 — ABNORMAL HIGH
Monocytes Relative: 8
Neutro Abs: 8.3 — ABNORMAL HIGH
Neutrophils Relative %: 77

## 2011-08-25 LAB — LIPASE, BLOOD: Lipase: 26

## 2011-08-25 NOTE — Telephone Encounter (Signed)
Telephone call to review Pap. Had a Pap with ascus with negative high-risk HPV in may of 2012. States has recently had a stroke and has congestive heart failure and requested to do Pap repeat later. Will return to the office in November or December when  feeling better for Pap.

## 2012-05-11 ENCOUNTER — Encounter: Payer: Self-pay | Admitting: *Deleted

## 2012-05-14 ENCOUNTER — Encounter: Payer: Self-pay | Admitting: Women's Health

## 2012-05-14 ENCOUNTER — Ambulatory Visit (INDEPENDENT_AMBULATORY_CARE_PROVIDER_SITE_OTHER): Payer: BC Managed Care – PPO | Admitting: Women's Health

## 2012-05-14 VITALS — BP 160/90 | Ht 62.25 in | Wt 192.0 lb

## 2012-05-14 DIAGNOSIS — Z01419 Encounter for gynecological examination (general) (routine) without abnormal findings: Secondary | ICD-10-CM

## 2012-05-14 NOTE — Progress Notes (Signed)
[name] [name] 1951-05-17 [id number]    History:    The patient presents for annual exam.  Had a stroke 07/28/2011. On MRI it did show 7 previous strokes, history of CVA in 2001, and 2009. TIAs in 2005. History of hypertension. Problems with angioedema in 2011. Osteopenia : spine T score -1.8 in 2011. History of a TAH in 2000 for DUB. History of CVA IN1 with a negative biopsy in 2009, negative HR HPV 2012. Has had both the Pneumovax and Zostovac. History of benign colon tumors has followup scheduled with Dr. Kinnie Scales  07/2012. History of normal mammograms.  Past medical history, past surgical history, family history and social history were all reviewed and documented in the EPIC chart. Has a son and adopted a daughter.   ROS:  A  ROS was performed and pertinent positives and negatives are included in the history.  Exam:  Filed Vitals:   05/14/12 1356  BP: 160/90    General appearance:  Slight drooping of right side of mouth, tremor Head/Neck:  Normal, without cervical or supraclavicular adenopathy. Thyroid:  Symmetrical, normal in size, without palpable masses or nodularity. Respiratory  Effort:  Normal  Auscultation:  Clear without wheezing or rhonchi Cardiovascular  Auscultation:  Regular rate, without rubs, murmurs or gallops  Edema/varicosities:  Not grossly evident Abdominal  Soft,nontender, without masses, guarding or rebound.  Liver/spleen:  No organomegaly noted  Hernia:  None appreciated  Skin  Inspection:  Grossly normal  Palpation:  Grossly normal Neurologic/psychiatric  Orientation:  Normal with appropriate conversation.  Mood/affect:  Normal  Genitourinary    Breasts: Examined lying and sitting.     Right: Without masses, retractions, discharge or axillary adenopathy.     Left: Without masses, retractions, discharge or axillary adenopathy.   Inguinal/mons:  Normal without inguinal adenopathy  External genitalia:  Normal  BUS/Urethra/Skene's glands:   Normal  Bladder:  Normal  Vagina:  Atrophic   Cervix:  Absent  Uterus:  Absent  Adnexa/parametria:     Rt: Without masses or tenderness.   Lt: Without masses or tenderness.  Anus and perineum: Normal  Digital rectal exam: Normal sphincter tone without palpated masses or tenderness  Assessment/Plan:  61 y.o. M. WF G1 P1 +1 adopted daughter for annual exam with numerous complaints.  TAH/DUB in 2000  VAIN1 negative biopsy 2009, negative HR HPV 2012 Vaginal atrophy Hypertension-Several CVAs last one September 2012-primary care manages labs and meds Osteopenia T score -1.8 at spine 2011  Plan: Instructed to schedule bone density, home safety and fall prevention discussed. SBE's, annual mammogram, calcium rich diet, vitamin D 2000 daily encouraged. Reviewed importance of increasing regular exercise for health. Has rare intercourse reviewed importance of vaginal lubricants, reviewed estrogen use risks. Reviewed new Pap screening guidelines, no Pap done. Reviewed Tdap Vaccine. Keep scheduled followup 9/13 with Dr. Kinnie Scales.     Harrington Challenger [facility], 2:52 PM 05/14/2012

## 2012-05-14 NOTE — Patient Instructions (Signed)
Tdap vaccine Health Recommendations for Postmenopausal Women Based on the Results of the Women's Health Initiative [facility]) and Other Studies The WHI is a major 15-year research program to address the most common causes of death, disability and poor quality of life in postmenopausal women. Some of these causes are heart disease, cancer, bone loss (osteoporosis) and others. Taking into account all of the findings from [facility] and other studies, here are bottom-line health recommendations for women: CARDIOVASCULAR DISEASE Heart Disease: A heart attack is a medical emergency. Know the signs and symptoms of a heart attack. Hormone therapy should not be used to prevent heart disease. In women with heart disease, hormone therapy should not be used to prevent further disease. Hormone therapy increases the risk of blood clots. Below are things women can do to reduce their risk for heart disease.   Do not smoke. If you smoke, quit. Women who smoke are 2 to 6 times more likely to suffer a heart attack than non-smoking women.   Aim for a healthy weight. Being overweight causes many preventable deaths. Eat a healthy and balanced diet and drink an adequate amount of liquids.   Get moving. Make a commitment to be more physically active. Aim for 30 minutes of activity on most, if not all days of the week.   Eat for heart health. Choose a diet that is low in saturated fat, trans fat, and cholesterol. Include whole grains, vegetables, and fruits. Read the labels on the food container before buying it.   Know your numbers. Ask your caregiver to check your blood pressure, cholesterol (total, HDL, LDL, triglycerides) and blood glucose. Work with your caregiver to improve any numbers that are not normal.   High blood pressure. Limit or stop your table salt intake (try salt substitute and food seasonings), avoid salty foods and drinks. Read the labels on the food container before buying it. Avoid becoming overweight by eating  well and exercising.  STROKE  Stroke is a medical emergency. Stroke can be the result of a blood clot in the blood vessel in the brain or by a brain hemorrhage (bleeding). Know the signs and symptoms of a stroke. To lower the risk of developing a stroke:  Avoid fatty foods.   Quit smoking.   Control your diabetes, blood pressure, and irregular heart rate.  THROMBOPHLIBITIS (BLOOD CLOT) OF THE LEG  Hormone treatment is a big cause of developing blood clots in the leg. Becoming overweight and leading a stationary lifestyle also may contribute to developing blood clots. Controlling your diet and exercising will help lower the risk of developing blood clots. CANCER SCREENING  Breast Cancer: Women should take steps to reduce their risk of breast cancer. This includes having regular mammograms, monthly self breast exams and regular breast exams by your caregiver. Have a mammogram every one to two years if you are 41 to 61 years old. Have a mammogram annually if you are 71 years old or older depending on your risk factors. Women who are high risk for breast cancer may need more frequent mammograms. There are tests available (testing the genes in your body) if you have family history of breast cancer called BRCA 1 and 2. These tests can help determine the risks of developing breast cancer.   Intestinal or Stomach Cancer: Women should talk to their caregiver about when to start screening, what tests and how often they should be done, and the benefits and risks of doing these tests. Tests to consider are a rectal  exam, fecal occult blood, sigmoidoscopy, colononoscoby, barium enema and upper GI series of the stomach. Depending on the age, you may want to get a medical and family history of colon cancer. Women who are high risk may need to be screened at an earlier age and more often.   Cervical Cancer: A Pap test of the cervix should be done every year and every 3 years when there has been three straight years  of a normal Pap test. Women with an abnormal Pap test should be screened more often or have a cervical biopsy depending on your caregiver's recommendation.   Uterine Cancer: If you have vaginal bleeding after you are in the menopause, it should be evaluated by your caregiver.   Ovarian cancer: There are no reliable tests available to screen for ovarian cancer at this time except for yearly pelvic exams.   Lung Cancer: Yearly chest X-rays can detect lung cancer and should be done on high risk women, such as cigarette smokers and women with chronic lung disease (emphysemia).   Skin Cancer: A complete body skin exam should be done at your yearly examination. Avoid overexposure to the sun and ultraviolet light lamps. Use a strong sun block cream when in the sun. All of these things are important in lowering the risk of skin cancer.  MENOPAUSE Menopause Symptoms: Hormone therapy products are effective for treating symptoms associated with menopause:  Moderate to severe hot flashes.   Night sweats.   Mood swings.   Headaches.   Tiredness.   Loss of sex drive.   Insomnia.   Other symptoms.  However, hormone therapy products carry serious risks, especially in older women. Women who use or are thinking about using estrogen or estrogen with progestin treatments should discuss that with their caregiver. Your caregiver will know if the benefits outweigh the risks. The Food and Drug Administration (FDA) has concluded that hormone therapy should be used only at the lowest doses and for the shortest amount of time to reach treatment goals. It is not known at what doses there may be less risk of serious side effects. There are other treatments such as herbal medication (not controlled or regulated by the FDA), group therapy, counseling and acupuncture that may be helpful. OSTEOPOROSIS Protecting Against Bone Loss and Preventing Fracture: If hormone therapy is used for prevention of bone loss  (osteoporosis), the risks for bone loss must outweigh the risk of the therapy. Women considering taking hormone therapy for bone loss should ask their health care providers about other medications (fosamax and boniva) that are considered safe and effective for preventing bone loss and bone fractures. To guard against bone loss or fractures, it is recommended that women should take at least 1000-1500 mg of calcium and 400-800 IU of vitamin D daily in divided doses. Smoking and excessive alcohol intake increases the risk of osteoporosis. Eat foods rich in calcium and vitamin D and do weight bearing exercises several times a week as your caregiver suggests. DIABETES Diabetes Melitus: Women with Type I or Type 2 diabetes should keep their diabetes in control with diet, exercise and medication. Avoid too many sweets, starchy and fatty foods. Being overweight can affect your diabetes. COGNITION AND MEMORY Cognition and Memory: Menopausal hormone therapy is not recommended for the prevention of cognitive disorders such as Alzheimer's disease or memory loss. WHI found that women treated with hormone therapy have a greater risk of developing dementia.  DEPRESSION  Depression may occur at any age, but is common in  elderly women. The reasons may be because of physical, medical, social (loneliness), financial and/or economic problems and needs. Becoming involved with church, volunteer or social groups, seeking treatment for any physical or medical problems is recommended. Also, look into getting professional advice for any economic or financial problems. ACCIDENTS  Accidents are common and can be serious in the elderly woman. Prepare your house to prevent accidents. Eliminate throw rugs, use hip protectors, place hand bars in the bath, shower and toilet areas. Avoid wearing high heel shoes and walking on wet, snowy and icy areas. Stop driving if you have vision, hearing problems or are unsteady with you movements and  reflexes. RHEUMATOID ARTHRITIS Rheumatoid arthritis causes pain, swelling and stiffness of your bone joints. It can limit many of your activities. Over-the-counter medications may help, but prescription medications may be necessary. Talk with your caregiver about this. Exercise (walking, water aerobics), good posture, using splints on painful joints, warm baths or applying warm compresses to stiff joints and cold compresses to painful joints may be helpful. Smoking and excessive drinking may worsen the symptoms of arthritis. Seek help from a physical therapist if the arthritis is becoming a problem with your daily activities. IMMUNIZATIONS  Several immunizations are important to have during your senior years, including:   Tetanus and a diptheria shot booster every 10 years.   Influenza every year before the flu season begins.   Pneumonia vaccine.   Shingles vaccine.   Others as indicated (example: H1N1 vaccine).  Document Released: 12/16/2005 Document Revised: 10/13/2011 Document Reviewed: 08/11/2008 [facility] Patient Information 2012 [place], [place].

## 2013-03-13 ENCOUNTER — Ambulatory Visit (INDEPENDENT_AMBULATORY_CARE_PROVIDER_SITE_OTHER): Payer: BC Managed Care – PPO | Admitting: Psychiatry

## 2013-03-13 ENCOUNTER — Encounter (HOSPITAL_COMMUNITY): Payer: Self-pay | Admitting: Psychiatry

## 2013-03-13 VITALS — BP 115/65 | HR 65 | Ht 62.0 in | Wt 198.0 lb

## 2013-03-13 DIAGNOSIS — F332 Major depressive disorder, recurrent severe without psychotic features: Secondary | ICD-10-CM

## 2013-03-13 DIAGNOSIS — F411 Generalized anxiety disorder: Secondary | ICD-10-CM

## 2013-03-13 MED ORDER — SERTRALINE HCL 25 MG PO TABS: 25.0000 mg | ORAL_TABLET | Freq: Every day | ORAL | Status: DC
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
## 2013-04-11 ENCOUNTER — Encounter (HOSPITAL_COMMUNITY): Payer: Self-pay | Admitting: Psychiatry

## 2013-04-11 ENCOUNTER — Ambulatory Visit (INDEPENDENT_AMBULATORY_CARE_PROVIDER_SITE_OTHER): Payer: BC Managed Care – PPO | Admitting: Psychiatry

## 2013-04-11 ENCOUNTER — Other Ambulatory Visit: Payer: Self-pay | Admitting: Women's Health

## 2013-04-11 VITALS — BP 110/57 | HR 70 | Ht 62.0 in | Wt 201.0 lb

## 2013-04-11 DIAGNOSIS — Z1231 Encounter for screening mammogram for malignant neoplasm of breast: Secondary | ICD-10-CM

## 2013-04-11 DIAGNOSIS — F411 Generalized anxiety disorder: Secondary | ICD-10-CM

## 2013-04-11 DIAGNOSIS — F332 Major depressive disorder, recurrent severe without psychotic features: Secondary | ICD-10-CM | POA: Insufficient documentation

## 2013-04-11 MED ORDER — SERTRALINE HCL 50 MG PO TABS: 50.0000 mg | ORAL_TABLET | Freq: Every day | ORAL | Status: DC
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
## 2013-04-12 ENCOUNTER — Telehealth (HOSPITAL_COMMUNITY): Payer: Self-pay

## 2013-04-12 ENCOUNTER — Encounter (HOSPITAL_COMMUNITY): Payer: Self-pay | Admitting: Psychiatry

## 2013-04-12 NOTE — Telephone Encounter (Addendum)
Called patient. The patient reports she did not take Xanax last night and start feeling shaky. I asked patient to take a xanax 0.5 mg now and through the weekend. Will have a slow taper at a later date. Asked patient to call on Monday.

## 2013-04-12 NOTE — Telephone Encounter (Signed)
Called patient. No answer at dialed number.

## 2013-04-15 ENCOUNTER — Telehealth (HOSPITAL_COMMUNITY): Payer: Self-pay

## 2013-04-15 NOTE — Telephone Encounter (Signed)
Called patent. Confirmed her current medications.

## 2013-04-23 ENCOUNTER — Ambulatory Visit (INDEPENDENT_AMBULATORY_CARE_PROVIDER_SITE_OTHER): Payer: BC Managed Care – PPO

## 2013-04-23 DIAGNOSIS — Z1231 Encounter for screening mammogram for malignant neoplasm of breast: Secondary | ICD-10-CM

## 2013-05-08 ENCOUNTER — Ambulatory Visit (HOSPITAL_COMMUNITY): Payer: Self-pay | Admitting: Psychiatry

## 2013-05-09 ENCOUNTER — Ambulatory Visit (INDEPENDENT_AMBULATORY_CARE_PROVIDER_SITE_OTHER): Payer: BC Managed Care – PPO | Admitting: Psychiatry

## 2013-05-09 ENCOUNTER — Ambulatory Visit (HOSPITAL_COMMUNITY): Payer: Self-pay | Admitting: Psychiatry

## 2013-05-09 ENCOUNTER — Other Ambulatory Visit: Payer: Self-pay | Admitting: Family Medicine

## 2013-05-09 ENCOUNTER — Encounter (HOSPITAL_COMMUNITY): Payer: Self-pay | Admitting: Psychiatry

## 2013-05-09 VITALS — BP 109/67 | HR 81 | Ht 62.0 in | Wt 198.0 lb

## 2013-05-09 DIAGNOSIS — F411 Generalized anxiety disorder: Secondary | ICD-10-CM

## 2013-05-09 DIAGNOSIS — Z780 Asymptomatic menopausal state: Secondary | ICD-10-CM

## 2013-05-09 DIAGNOSIS — F332 Major depressive disorder, recurrent severe without psychotic features: Secondary | ICD-10-CM

## 2013-05-09 MED ORDER — SERTRALINE HCL 50 MG PO TABS: 50.0000 mg | ORAL_TABLET | Freq: Every day | ORAL | Status: DC
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
## 2013-06-20 ENCOUNTER — Encounter (HOSPITAL_COMMUNITY): Payer: Self-pay | Admitting: Psychiatry

## 2013-06-20 ENCOUNTER — Ambulatory Visit (INDEPENDENT_AMBULATORY_CARE_PROVIDER_SITE_OTHER): Payer: BC Managed Care – PPO | Admitting: Psychiatry

## 2013-06-20 VITALS — BP 167/92 | HR 60 | Ht 62.0 in | Wt 192.0 lb

## 2013-06-20 DIAGNOSIS — F411 Generalized anxiety disorder: Secondary | ICD-10-CM

## 2013-06-20 DIAGNOSIS — F332 Major depressive disorder, recurrent severe without psychotic features: Secondary | ICD-10-CM

## 2013-06-20 MED ORDER — SERTRALINE HCL 50 MG PO TABS: 50.0000 mg | ORAL_TABLET | Freq: Every day | ORAL | Status: DC
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
## 2013-06-26 ENCOUNTER — Telehealth (HOSPITAL_COMMUNITY): Payer: Self-pay

## 2013-06-26 NOTE — Telephone Encounter (Signed)
The patient reports that she has been trying not taking Xanax. She has continued anxiety.  Asked patient increase sertraline to 75 mg daily and call in 7-10 days to report effect.

## 2013-07-02 ENCOUNTER — Ambulatory Visit (INDEPENDENT_AMBULATORY_CARE_PROVIDER_SITE_OTHER): Payer: BC Managed Care – PPO

## 2013-07-02 DIAGNOSIS — M899 Disorder of bone, unspecified: Secondary | ICD-10-CM

## 2013-07-02 DIAGNOSIS — Z780 Asymptomatic menopausal state: Secondary | ICD-10-CM

## 2013-07-22 ENCOUNTER — Ambulatory Visit (HOSPITAL_COMMUNITY): Payer: Self-pay | Admitting: Psychiatry

## 2013-07-24 ENCOUNTER — Encounter (HOSPITAL_COMMUNITY): Payer: Self-pay | Admitting: Psychiatry

## 2013-07-24 ENCOUNTER — Ambulatory Visit (INDEPENDENT_AMBULATORY_CARE_PROVIDER_SITE_OTHER): Payer: BC Managed Care – PPO | Admitting: Psychiatry

## 2013-07-24 VITALS — BP 156/91 | HR 81 | Ht 62.0 in | Wt 202.0 lb

## 2013-07-24 DIAGNOSIS — F332 Major depressive disorder, recurrent severe without psychotic features: Secondary | ICD-10-CM

## 2013-07-24 DIAGNOSIS — F411 Generalized anxiety disorder: Secondary | ICD-10-CM

## 2013-07-24 MED ORDER — SERTRALINE HCL 50 MG PO TABS: 50.0000 mg | ORAL_TABLET | Freq: Every day | ORAL | Status: DC
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
## 2013-07-25 ENCOUNTER — Ambulatory Visit (HOSPITAL_COMMUNITY): Payer: Self-pay | Admitting: Psychiatry

## 2013-08-23 ENCOUNTER — Encounter (INDEPENDENT_AMBULATORY_CARE_PROVIDER_SITE_OTHER): Payer: Self-pay

## 2013-08-23 ENCOUNTER — Ambulatory Visit (INDEPENDENT_AMBULATORY_CARE_PROVIDER_SITE_OTHER): Payer: BC Managed Care – PPO | Admitting: Psychiatry

## 2013-08-23 ENCOUNTER — Encounter (HOSPITAL_COMMUNITY): Payer: Self-pay | Admitting: Psychiatry

## 2013-08-23 VITALS — BP 125/83 | HR 90 | Ht 62.0 in | Wt 201.0 lb

## 2013-08-23 DIAGNOSIS — F411 Generalized anxiety disorder: Secondary | ICD-10-CM

## 2013-08-23 DIAGNOSIS — F332 Major depressive disorder, recurrent severe without psychotic features: Secondary | ICD-10-CM

## 2013-08-23 MED ORDER — HYDROXYZINE PAMOATE 25 MG PO CAPS: 25.0000 mg | ORAL_CAPSULE | Freq: Three times a day (TID) | ORAL | Status: DC | PRN

## 2013-08-23 MED ORDER — SERTRALINE HCL 50 MG PO TABS: 50.0000 mg | ORAL_TABLET | Freq: Every day | ORAL | Status: DC
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
## 2013-08-28 ENCOUNTER — Telehealth (HOSPITAL_COMMUNITY): Payer: Self-pay

## 2013-08-29 MED ORDER — SERTRALINE HCL 50 MG PO TABS: 50.0000 mg | ORAL_TABLET | Freq: Every day | ORAL | Status: DC

## 2013-08-29 NOTE — Telephone Encounter (Signed)
Return patient on left message. She does have Zoloft already ordered. Advised her to continue it. Patient may call with concerns.

## 2013-10-01 ENCOUNTER — Telehealth (HOSPITAL_COMMUNITY): Payer: Self-pay

## 2013-10-01 NOTE — Telephone Encounter (Signed)
NEEDS TO SPEAK TO YOU ABOUT ANXIETY

## 2013-10-01 NOTE — Telephone Encounter (Signed)
Called patient.  She is currently prescribed Xanax by her PCP and recently filled the medication (09/17/2013). Patient reports that she decrease her zoloft to 50 mg daily as she feels it is more helpful at this time.  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 She reports she has taken   Plan: Asked patient to increase her hydroxyzine 75 mg TID, PRN.

## 2013-10-08 ENCOUNTER — Ambulatory Visit (HOSPITAL_COMMUNITY): Payer: Self-pay | Admitting: Psychiatry

## 2013-11-07 DIAGNOSIS — I639 Cerebral infarction, unspecified: Secondary | ICD-10-CM

## 2013-11-07 HISTORY — DX: Cerebral infarction, unspecified: I63.9

## 2013-11-26 ENCOUNTER — Encounter (INDEPENDENT_AMBULATORY_CARE_PROVIDER_SITE_OTHER): Payer: Self-pay

## 2013-11-26 ENCOUNTER — Ambulatory Visit (INDEPENDENT_AMBULATORY_CARE_PROVIDER_SITE_OTHER): Payer: BC Managed Care – PPO | Admitting: Psychiatry

## 2013-11-26 ENCOUNTER — Encounter (HOSPITAL_COMMUNITY): Payer: Self-pay | Admitting: Psychiatry

## 2013-11-26 VITALS — BP 131/76 | HR 81 | Wt 198.0 lb

## 2013-11-26 DIAGNOSIS — F411 Generalized anxiety disorder: Secondary | ICD-10-CM

## 2013-11-26 DIAGNOSIS — F332 Major depressive disorder, recurrent severe without psychotic features: Secondary | ICD-10-CM

## 2013-11-26 MED ORDER — PAROXETINE HCL 10 MG PO TABS: ORAL_TABLET | ORAL | Status: DC
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
## 2013-11-28 ENCOUNTER — Telehealth (HOSPITAL_COMMUNITY): Payer: Self-pay

## 2013-11-28 MED ORDER — SERTRALINE HCL 100 MG PO TABS: 100.0000 mg | ORAL_TABLET | Freq: Every day | ORAL | Status: AC

## 2013-11-28 NOTE — Telephone Encounter (Signed)
Called patient. She had nausea with the paxil.  She states she increase her Zoloft to 100 mg and slept well.   PLAN: Will increase sertraline to 100 mg daily.

## 2013-12-12 ENCOUNTER — Telehealth (HOSPITAL_COMMUNITY): Payer: Self-pay

## 2013-12-13 NOTE — Telephone Encounter (Signed)
Called patient. Acknowledged she has decreased her Zoloft due to hair loss, though other causes of hair loss must be ruled out.

## 2013-12-27 ENCOUNTER — Telehealth (HOSPITAL_COMMUNITY): Payer: Self-pay | Admitting: Psychiatry

## 2013-12-27 NOTE — Telephone Encounter (Signed)
Erroneous encounter

## 2014-01-02 ENCOUNTER — Ambulatory Visit (HOSPITAL_COMMUNITY): Payer: Self-pay | Admitting: Psychiatry

## 2014-08-06 ENCOUNTER — Other Ambulatory Visit: Payer: Self-pay | Admitting: Family Medicine

## 2014-08-06 DIAGNOSIS — Z139 Encounter for screening, unspecified: Secondary | ICD-10-CM

## 2014-08-07 ENCOUNTER — Ambulatory Visit: Payer: Self-pay

## 2014-08-13 ENCOUNTER — Ambulatory Visit (INDEPENDENT_AMBULATORY_CARE_PROVIDER_SITE_OTHER): Payer: BC Managed Care – PPO

## 2014-08-13 DIAGNOSIS — R922 Inconclusive mammogram: Secondary | ICD-10-CM

## 2014-08-13 DIAGNOSIS — Z139 Encounter for screening, unspecified: Secondary | ICD-10-CM

## 2014-08-15 ENCOUNTER — Other Ambulatory Visit: Payer: Self-pay | Admitting: Family Medicine

## 2014-08-15 DIAGNOSIS — R928 Other abnormal and inconclusive findings on diagnostic imaging of breast: Secondary | ICD-10-CM

## 2014-08-26 ENCOUNTER — Ambulatory Visit
Admission: RE | Admit: 2014-08-26 | Discharge: 2014-08-26 | Disposition: A | Discharge status: Home / Self Care | Payer: BC Managed Care – PPO | Source: Ambulatory Visit | Attending: Family Medicine | Admitting: Family Medicine

## 2014-08-26 DIAGNOSIS — R928 Other abnormal and inconclusive findings on diagnostic imaging of breast: Secondary | ICD-10-CM

## 2014-09-08 ENCOUNTER — Encounter (HOSPITAL_COMMUNITY): Payer: Self-pay | Admitting: Psychiatry

## 2016-05-04 ENCOUNTER — Encounter: Payer: Self-pay | Admitting: Women's Health

## 2016-05-04 ENCOUNTER — Ambulatory Visit (INDEPENDENT_AMBULATORY_CARE_PROVIDER_SITE_OTHER): Payer: BLUE CROSS/BLUE SHIELD | Admitting: Women's Health

## 2016-05-04 VITALS — BP 122/80 | Ht 61.0 in | Wt 182.0 lb

## 2016-05-04 DIAGNOSIS — B373 Candidiasis of vulva and vagina: Secondary | ICD-10-CM

## 2016-05-04 DIAGNOSIS — B3731 Acute candidiasis of vulva and vagina: Secondary | ICD-10-CM

## 2016-05-04 DIAGNOSIS — Z01419 Encounter for gynecological examination (general) (routine) without abnormal findings: Secondary | ICD-10-CM

## 2016-05-04 LAB — WET PREP FOR TRICH, YEAST, CLUE
CLUE CELLS WET PREP: NONE SEEN
TRICH WET PREP: NONE SEEN
YEAST WET PREP: NONE SEEN

## 2016-05-04 MED ORDER — TERCONAZOLE 0.4 % VA CREA: 1.0000 | TOPICAL_CREAM | Freq: Every day | VAGINAL | Status: DC

## 2016-05-04 NOTE — Patient Instructions (Signed)
Menopause is a normal process in which your reproductive ability comes to an end. This process happens gradually over a span of months to years, usually between the ages of 48 and 55. Menopause is complete when you have missed 12 consecutive menstrual periods. It is important to talk with your health care provider about some of the most common conditions that affect postmenopausal women, such as heart disease, cancer, and bone loss (osteoporosis). Adopting a healthy lifestyle and getting preventive care can help to promote your health and wellness. Those actions can also lower your chances of developing some of these common conditions. WHAT SHOULD I KNOW ABOUT MENOPAUSE? During menopause, you may experience a number of symptoms, such as:  Moderate-to-severe hot flashes.  Night sweats.  Decrease in sex drive.  Mood swings.  Headaches.  Tiredness.  Irritability.  Memory problems.  Insomnia. Choosing to treat or not to treat menopausal changes is an individual decision that you make with your health care provider. WHAT SHOULD I KNOW ABOUT HORMONE REPLACEMENT THERAPY AND SUPPLEMENTS? Hormone therapy products are effective for treating symptoms that are associated with menopause, such as hot flashes and night sweats. Hormone replacement carries certain risks, especially as you become older. If you are thinking about using estrogen or estrogen with progestin treatments, discuss the benefits and risks with your health care provider. WHAT SHOULD I KNOW ABOUT HEART DISEASE AND STROKE? Heart disease, heart attack, and stroke become more likely as you age. This may be due, in part, to the hormonal changes that your body experiences during menopause. These can affect how your body processes dietary fats, triglycerides, and cholesterol. Heart attack and stroke are both medical emergencies. There are many things that you can do to help prevent heart disease and stroke:  Have your blood pressure  checked at least every 1-2 years. High blood pressure causes heart disease and increases the risk of stroke.  If you are 65-79 years old, ask your health care provider if you should take aspirin to prevent a heart attack or a stroke.  Do not use any tobacco products, including cigarettes, chewing tobacco, or electronic cigarettes. If you need help quitting, ask your health care provider.  It is important to eat a healthy diet and maintain a healthy weight.  Be sure to include plenty of vegetables, fruits, low-fat dairy products, and lean protein.  Avoid eating foods that are high in solid fats, added sugars, or salt (sodium).  Get regular exercise. This is one of the most important things that you can do for your health.  Try to exercise for at least 150 minutes each week. The type of exercise that you do should increase your heart rate and make you sweat. This is known as moderate-intensity exercise.  Try to do strengthening exercises at least twice each week. Do these in addition to the moderate-intensity exercise.  Know your numbers.Ask your health care provider to check your cholesterol and your blood glucose. Continue to have your blood tested as directed by your health care provider. WHAT SHOULD I KNOW ABOUT CANCER SCREENING? There are several types of cancer. Take the following steps to reduce your risk and to catch any cancer development as early as possible. Breast Cancer  Practice breast self-awareness.  This means understanding how your breasts normally appear and feel.  It also means doing regular breast self-exams. Let your health care provider know about any changes, no matter how small.  If you are 40 or older, have a clinician do a   breast exam (clinical breast exam or CBE) every year. Depending on your age, family history, and medical history, it may be recommended that you also have a yearly breast X-ray (mammogram).  If you have a family history of breast cancer,  talk with your health care provider about genetic screening.  If you are at high risk for breast cancer, talk with your health care provider about having an MRI and a mammogram every year.  Breast cancer (BRCA) gene test is recommended for women who have family members with BRCA-related cancers. Results of the assessment will determine the need for genetic counseling and BRCA1 and for BRCA2 testing. BRCA-related cancers include these types:  Breast. This occurs in males or females.  Ovarian.  Tubal. This may also be called fallopian tube cancer.  Cancer of the abdominal or pelvic lining (peritoneal cancer).  Prostate.  Pancreatic. Cervical, Uterine, and Ovarian Cancer Your health care provider may recommend that you be screened regularly for cancer of the pelvic organs. These include your ovaries, uterus, and vagina. This screening involves a pelvic exam, which includes checking for microscopic changes to the surface of your cervix (Pap test).  For women ages 21-65, health care providers may recommend a pelvic exam and a Pap test every three years. For women ages 77-65, they may recommend the Pap test and pelvic exam, combined with testing for human papilloma virus (HPV), every five years. Some types of HPV increase your risk of cervical cancer. Testing for HPV may also be done on women of any age who have unclear Pap test results.  Other health care providers may not recommend any screening for nonpregnant women who are considered low risk for pelvic cancer and have no symptoms. Ask your health care provider if a screening pelvic exam is right for you.  If you have had past treatment for cervical cancer or a condition that could lead to cancer, you need Pap tests and screening for cancer for at least 20 years after your treatment. If Pap tests have been discontinued for you, your risk factors (such as having a new sexual partner) need to be reassessed to determine if you should start having  screenings again. Some women have medical problems that increase the chance of getting cervical cancer. In these cases, your health care provider may recommend that you have screening and Pap tests more often.  If you have a family history of uterine cancer or ovarian cancer, talk with your health care provider about genetic screening.  If you have vaginal bleeding after reaching menopause, tell your health care provider.  There are currently no reliable tests available to screen for ovarian cancer. Lung Cancer Lung cancer screening is recommended for adults 3-70 years old who are at high risk for lung cancer because of a history of smoking. A yearly low-dose CT scan of the lungs is recommended if you:  Currently smoke.  Have a history of at least 30 pack-years of smoking and you currently smoke or have quit within the past 15 years. A pack-year is smoking an average of one pack of cigarettes per day for one year. Yearly screening should:  Continue until it has been 15 years since you quit.  Stop if you develop a health problem that would prevent you from having lung cancer treatment. Colorectal Cancer  This type of cancer can be detected and can often be prevented.  Routine colorectal cancer screening usually begins at age 38 and continues through age 12.  If you have  risk factors for colon cancer, your health care provider may recommend that you be screened at an earlier age.  If you have a family history of colorectal cancer, talk with your health care provider about genetic screening.  Your health care provider may also recommend using home test kits to check for hidden blood in your stool.  A small camera at the end of a tube can be used to examine your colon directly (sigmoidoscopy or colonoscopy). This is done to check for the earliest forms of colorectal cancer.  Direct examination of the colon should be repeated every 5-10 years until age 67. However, if early forms of  precancerous polyps or small growths are found or if you have a family history or genetic risk for colorectal cancer, you may need to be screened more often. Skin Cancer  Check your skin from head to toe regularly.  Monitor any moles. Be sure to tell your health care provider:  About any new moles or changes in moles, especially if there is a change in a mole's shape or color.  If you have a mole that is larger than the size of a pencil eraser.  If any of your family members has a history of skin cancer, especially at a [place]ay [place] age, talk with your health care provider about genetic screening.  Always use sunscreen. Apply sunscreen liberally and repeatedly throughout the day.  Whenever you are outside, protect yourself by wearing long sleeves, pants, a wide-brimmed hat, and sunglasses. WHAT SHOULD I KNOW ABOUT OSTEOPOROSIS? Osteoporosis is a condition in which bone destruction happens more quickly than new bone creation. After menopause, you may be at an increased risk for osteoporosis. To help prevent osteoporosis or the bone fractures that can happen because of osteoporosis, the following is recommended:  If you are 39-61 years old, get at least 1,000 mg of calcium and at least 600 mg of vitamin D per day.  If you are older than age 16 but younger than age 7, get at least 1,200 mg of calcium and at least 600 mg of vitamin D per day.  If you are older than age 47, get at least 1,200 mg of calcium and at least 800 mg of vitamin D per day. Smoking and excessive alcohol intake increase the risk of osteoporosis. Eat foods that are rich in calcium and vitamin D, and do weight-bearing exercises several times each week as directed by your health care provider. WHAT SHOULD I KNOW ABOUT HOW MENOPAUSE AFFECTS [place]? Depression may occur at any age, but it is more common as you become older. Common symptoms of depression include:  Low or sad mood.  Changes in sleep patterns.  Changes  in appetite or eating patterns.  Feeling an overall lack of motivation or enjoyment of activities that you previously enjoyed.  Frequent crying spells. Talk with your health care provider if you think that you are experiencing depression. WHAT SHOULD I KNOW ABOUT IMMUNIZATIONS? It is important that you get and maintain your immunizations. These include:  Tetanus, diphtheria, and pertussis (Tdap) booster vaccine.  Influenza every year before the flu season begins.  Pneumonia vaccine.  Shingles vaccine. Your health care provider may also recommend other immunizations.   This information is not intended to replace advice given to you by your health care provider. Make sure you discuss any questions you have with your health care provider.   Document Released: 12/16/2005 Document Revised: 11/14/2014 Document Reviewed: 06/26/2014 Elsevier Interactive Patient Education 2016 Elsevier  Inc[employer] Urinary Tract Infection Urinary tract infections (UTIs) can develop anywhere along your urinary tract. Your urinary tract is your body's drainage system for removing wastes and extra water. Your urinary tract includes two kidneys, two ureters, a bladder, and a urethra. Your kidneys are a pair of bean-shaped organs. Each kidney is about the size of your fist. They are located below your ribs, one on each side of your spine. CAUSES Infections are caused by microbes, which are microscopic organisms, including fungi, viruses, and bacteria. These organisms are so small that they can only be seen through a microscope. Bacteria are the microbes that most commonly cause UTIs. SYMPTOMS  Symptoms of UTIs may vary by age and gender of the patient and by the location of the infection. Symptoms in [place] women typically include a frequent and intense urge to urinate and a painful, burning feeling in the bladder or urethra during urination. Older women and men are more likely to be tired, shaky, and weak and have muscle aches  and abdominal pain. A fever may mean the infection is in your kidneys. Other symptoms of a kidney infection include pain in your back or sides below the ribs, nausea, and vomiting. DIAGNOSIS To diagnose a UTI, your caregiver will ask you about your symptoms. Your caregiver will also ask you to provide a urine sample. The urine sample will be tested for bacteria and white blood cells. White blood cells are made by your body to help fight infection. TREATMENT  Typically, UTIs can be treated with medication. Because most UTIs are caused by a bacterial infection, they usually can be treated with the use of antibiotics. The choice of antibiotic and length of treatment depend on your symptoms and the type of bacteria causing your infection. HOME CARE INSTRUCTIONS  If you were prescribed antibiotics, take them exactly as your caregiver instructs you. Finish the medication even if you feel better after you have only taken some of the medication.  Drink enough water and fluids to keep your urine clear or pale yellow.  Avoid caffeine, tea, and carbonated beverages. They tend to irritate your bladder.  Empty your bladder often. Avoid holding urine for long periods of time.  Empty your bladder before and after sexual intercourse.  After a bowel movement, women should cleanse from front to back. Use each tissue only once. SEEK MEDICAL CARE IF:   You have back pain.  You develop a fever.  Your symptoms do not begin to resolve within 3 days. SEEK IMMEDIATE MEDICAL CARE IF:   You have severe back pain or lower abdominal pain.  You develop chills.  You have nausea or vomiting.  You have continued burning or discomfort with urination. MAKE SURE YOU:   Understand these instructions.  Will watch your condition.  Will get help right away if you are not doing well or get worse.   This information is not intended to replace advice given to you by your health care provider. Make sure you discuss  any questions you have with your health care provider.   Document Released: 08/03/2005 Document Revised: 07/15/2015 Document Reviewed: 12/02/2011 Elsevier Interactive Patient Education [employer].

## 2016-05-04 NOTE — Progress Notes (Signed)
[name] LericheCarla R [name] 04/07/1951 [id number]    History:    Presents for annual exam.  2000 TAH for dysfunctional uterine bleeding. Not sexually active/health. No HRT. Has had numerous strokes, last one 2015. Having numbness on left side of body, able to walk. 2016 pulmonary issues necessitating oxygen at home currently not bleeding. History of hypertension on numerous medications. Normal Pap and mammogram history, overdue for mammogram. DEXA at primary care osteopenia without elevated FRAX. Current on vaccines.  Has had a problem with recurrent UTIs, managed by PCP, has follow-up scheduled with Urologist.  Past medical history, past surgical history, family history and social history were all reviewed and documented in the EPIC chart. 2 children, one son, adopted daughter, one grandson [id number], stepgrandson [id number]  ROS:  A ROS was performed and pertinent positives and negatives are included.  Exam:  Filed Vitals:   05/04/16 1201  BP: 122/80    General appearance:  Normal Thyroid:  Symmetrical, normal in size, without palpable masses or nodularity. Respiratory  Auscultation:  Clear without wheezing or rhonchi Cardiovascular  Auscultation:  Regular rate, without rubs, murmurs or gallops  Edema/varicosities:  Not grossly evident Abdominal  Soft,nontender, without masses, guarding or rebound.  Liver/spleen:  No organomegaly noted  Hernia:  None appreciated  Skin  Inspection:  Grossly normal   Breasts: Examined lying and sitting.     Right: Without masses, retractions, discharge or axillary adenopathy.     Left: Without masses, retractions, discharge or axillary adenopathy. Gentitourinary   Inguinal/mons:  Normal without inguinal adenopathy  External genitalia:  External genitalia erythema  BUS/Urethra/Skene's glands:  Normal  Vagina:  Atrophic wet prep negative  Cervix:  And uterus absent  Adnexa/parametria:     Rt: Without masses or tenderness.   Lt: Without masses or tenderness.  Anus and  perineum: Normal  Digital rectal exam: Normal sphincter tone without palpated masses or tenderness  Assessment/Plan:  65 y.o. MWF G1P1 +1 adopted for annual exam with mild external itching.   2000 TAH for dysfunctional uterine bleeding on no HRT Hypertension, hypothyroidism-primary care manages labs and meds History of numerous strokes causing left-sided numbness Osteopenia primary care manages   Plan: SBE's, reviewed importance of annual screening mammogram instructed to schedule overdue. Increase regular exercise as able encourage daily walk. Calcium rich diet, vitamin D 1000 and over-the-counter encouraged. Safety, fall prevention and importance of weightbearing exercise reviewed. Terazol 7 apply small amount external genitalia for itching. Yeast prevention discussed.    Harrington ChallengerYOUNG,NANCY J WHNP, 1:08 PM 05/04/2016

## 2016-05-04 NOTE — Addendum Note (Signed)
Addended by: Aura CampsWEBB, JENNIFER L on: 05/04/2016 02:20 PM   Modules accepted: Orders

## 2016-05-05 LAB — URINALYSIS W MICROSCOPIC + REFLEX CULTURE
BACTERIA UA: NONE SEEN [HPF]
Bilirubin Urine: NEGATIVE
CASTS: NONE SEEN [LPF]
Crystals: NONE SEEN [HPF]
Glucose, UA: NEGATIVE
HGB URINE DIPSTICK: NEGATIVE
Ketones, ur: NEGATIVE
Leukocytes, UA: NEGATIVE
Nitrite: NEGATIVE
PROTEIN: NEGATIVE
SQUAMOUS EPITHELIAL / LPF: NONE SEEN [HPF] (ref <=5)
Specific Gravity, Urine: 1.013 (ref 1.001–1.035)
YEAST: NONE SEEN [HPF]
pH: 5.5 (ref 5.0–8.0)

## 2016-05-06 LAB — URINE CULTURE: Colony Count: 6000

## 2017-03-22 ENCOUNTER — Encounter: Payer: Self-pay | Admitting: Gynecology

## 2017-05-05 ENCOUNTER — Encounter: Payer: BLUE CROSS/BLUE SHIELD | Admitting: Women's Health

## 2017-05-16 ENCOUNTER — Encounter: Payer: BLUE CROSS/BLUE SHIELD | Admitting: Women's Health

## 2017-05-23 ENCOUNTER — Ambulatory Visit (INDEPENDENT_AMBULATORY_CARE_PROVIDER_SITE_OTHER): Payer: BLUE CROSS/BLUE SHIELD | Admitting: Women's Health

## 2017-05-23 ENCOUNTER — Encounter: Payer: Self-pay | Admitting: Women's Health

## 2017-05-23 ENCOUNTER — Other Ambulatory Visit: Payer: Self-pay | Admitting: Women's Health

## 2017-05-23 VITALS — BP 122/80 | Ht 61.25 in | Wt 198.0 lb

## 2017-05-23 DIAGNOSIS — Z01419 Encounter for gynecological examination (general) (routine) without abnormal findings: Secondary | ICD-10-CM

## 2017-05-23 DIAGNOSIS — Z1231 Encounter for screening mammogram for malignant neoplasm of breast: Secondary | ICD-10-CM

## 2017-05-23 NOTE — Progress Notes (Signed)
[name] LericheCarla R [name] 03/26/1951 [id number]    History:    Presents for annual exam.  2000 TAH for DUB on no HRT. Normal Pap and mammogram history overdue for mammogram. Colonoscopy in 2016 5 year follow-up. Vaccines current. Osteopenia, hypertension, hypothyroid-primary care manages. History of a stroke, left-sided weakness and numbness,  heart and lung problems uses 02 at home.  Past medical history, past surgical history, family history and social history were all reviewed and documented in the EPIC chart. [place] Has been to the beach, continues to have limited exercise ability. Son and adopted daughter.  ROS:  A ROS was performed and pertinent positives and negatives are included.  Exam:  Vitals:   05/23/17 1206  BP: 122/80  Weight: 198 lb (89.8 kg)  Height: 5' 1.25" (1.556 m)   Body mass index is 37.11 kg/m.   General appearance:  Normal Thyroid:  Symmetrical, normal in size, without palpable masses or nodularity. Respiratory  Auscultation:  Clear without wheezing or rhonchi Cardiovascular  Auscultation:  Regular rate, without rubs, murmurs or gallops  Edema/varicosities:  Not grossly evident Abdominal  Soft,nontender, without masses, guarding or rebound.  Liver/spleen:  No organomegaly noted  Hernia:  None appreciated  Skin  Inspection:  Grossly normal   Breasts: Examined lying and sitting.     Right: Without masses, retractions, discharge or axillary adenopathy.     Left: Without masses, retractions, discharge or axillary adenopathy. Gentitourinary   Inguinal/mons:  Normal without inguinal adenopathy  External genitalia:  Normal  BUS/Urethra/Skene's glands:  Normal  Vagina:  Atrophic  Cervix:  And uterus absent  Adnexa/parametria:     Rt: Without masses or tenderness.   Lt: Without masses or tenderness.  Anus and perineum: Normal  Digital rectal exam: Normal sphincter tone without palpated masses or tenderness  Assessment/Plan:   66 y.o. MWF G1 P1 +1 adopted  for annual exam with no GYN complaints.  2000 TAH on no HRT Hypertension/hypothyroid/osteopenia/anxiety - depression/hypercholesterolemia-primary care manages labs and meds Stroke-left-sided weakness Obesity  Plan: Reviewed importance of increasing walking, encouraged to walk 10 minutes 3 times daily in her house. Home safety, fall prevention and importance of weightbearing exercise reviewed. SBE's, overdue for screening annual mammogram instructed to schedule. Vitamin D 1000 daily encouraged. Pap screenings reviewed. Vaginal lubricants as needed for intercourse encouraged.    Harrington Challengerancy J Young [facility] CenterWHNP, 12:32 PM 05/23/2017

## 2017-05-23 NOTE — Patient Instructions (Signed)
Health Maintenance for Postmenopausal Women Menopause is a normal process in which your reproductive ability comes to an end. This process happens gradually over a span of months to years, usually between the ages of 22 and 9. Menopause is complete when you have missed 12 consecutive menstrual periods. It is important to talk with your health care provider about some of the most common conditions that affect postmenopausal women, such as heart disease, cancer, and bone loss (osteoporosis). Adopting a healthy lifestyle and getting preventive care can help to promote your health and wellness. Those actions can also lower your chances of developing some of these common conditions. What should I know about menopause? During menopause, you may experience a number of symptoms, such as:  Moderate-to-severe hot flashes.  Night sweats.  Decrease in sex drive.  Mood swings.  Headaches.  Tiredness.  Irritability.  Memory problems.  Insomnia.  Choosing to treat or not to treat menopausal changes is an individual decision that you make with your health care provider. What should I know about hormone replacement therapy and supplements? Hormone therapy products are effective for treating symptoms that are associated with menopause, such as hot flashes and night sweats. Hormone replacement carries certain risks, especially as you become older. If you are thinking about using estrogen or estrogen with progestin treatments, discuss the benefits and risks with your health care provider. What should I know about heart disease and stroke? Heart disease, heart attack, and stroke become more likely as you age. This may be due, in part, to the hormonal changes that your body experiences during menopause. These can affect how your body processes dietary fats, triglycerides, and cholesterol. Heart attack and stroke are both medical emergencies. There are many things that you can do to help prevent heart disease  and stroke:  Have your blood pressure checked at least every 1-2 years. High blood pressure causes heart disease and increases the risk of stroke.  If you are 53-22 years old, ask your health care provider if you should take aspirin to prevent a heart attack or a stroke.  Do not use any tobacco products, including cigarettes, chewing tobacco, or electronic cigarettes. If you need help quitting, ask your health care provider.  It is important to eat a healthy diet and maintain a healthy weight. ? Be sure to include plenty of vegetables, fruits, low-fat dairy products, and lean protein. ? Avoid eating foods that are high in solid fats, added sugars, or salt (sodium).  Get regular exercise. This is one of the most important things that you can do for your health. ? Try to exercise for at least 150 minutes each week. The type of exercise that you do should increase your heart rate and make you sweat. This is known as moderate-intensity exercise. ? Try to do strengthening exercises at least twice each week. Do these in addition to the moderate-intensity exercise.  Know your numbers.Ask your health care provider to check your cholesterol and your blood glucose. Continue to have your blood tested as directed by your health care provider.  What should I know about cancer screening? There are several types of cancer. Take the following steps to reduce your risk and to catch any cancer development as early as possible. Breast Cancer  Practice breast self-awareness. ? This means understanding how your breasts normally appear and feel. ? It also means doing regular breast self-exams. Let your health care provider know about any changes, no matter how small.  If you are 40  or older, have a clinician do a breast exam (clinical breast exam or CBE) every year. Depending on your age, family history, and medical history, it may be recommended that you also have a yearly breast X-ray (mammogram).  If you  have a family history of breast cancer, talk with your health care provider about genetic screening.  If you are at high risk for breast cancer, talk with your health care provider about having an MRI and a mammogram every year.  Breast cancer (BRCA) gene test is recommended for women who have family members with BRCA-related cancers. Results of the assessment will determine the need for genetic counseling and BRCA1 and for BRCA2 testing. BRCA-related cancers include these types: ? Breast. This occurs in males or females. ? Ovarian. ? Tubal. This may also be called fallopian tube cancer. ? Cancer of the abdominal or pelvic lining (peritoneal cancer). ? Prostate. ? Pancreatic.  Cervical, Uterine, and Ovarian Cancer Your health care provider may recommend that you be screened regularly for cancer of the pelvic organs. These include your ovaries, uterus, and vagina. This screening involves a pelvic exam, which includes checking for microscopic changes to the surface of your cervix (Pap test).  For women ages 21-65, health care providers may recommend a pelvic exam and a Pap test every three years. For women ages 79-65, they may recommend the Pap test and pelvic exam, combined with testing for human papilloma virus (HPV), every five years. Some types of HPV increase your risk of cervical cancer. Testing for HPV may also be done on women of any age who have unclear Pap test results.  Other health care providers may not recommend any screening for nonpregnant women who are considered low risk for pelvic cancer and have no symptoms. Ask your health care provider if a screening pelvic exam is right for you.  If you have had past treatment for cervical cancer or a condition that could lead to cancer, you need Pap tests and screening for cancer for at least 20 years after your treatment. If Pap tests have been discontinued for you, your risk factors (such as having a new sexual partner) need to be  reassessed to determine if you should start having screenings again. Some women have medical problems that increase the chance of getting cervical cancer. In these cases, your health care provider may recommend that you have screening and Pap tests more often.  If you have a family history of uterine cancer or ovarian cancer, talk with your health care provider about genetic screening.  If you have vaginal bleeding after reaching menopause, tell your health care provider.  There are currently no reliable tests available to screen for ovarian cancer.  Lung Cancer Lung cancer screening is recommended for adults 69-62 years old who are at high risk for lung cancer because of a history of smoking. A yearly low-dose CT scan of the lungs is recommended if you:  Currently smoke.  Have a history of at least 30 pack-years of smoking and you currently smoke or have quit within the past 15 years. A pack-year is smoking an average of one pack of cigarettes per day for one year.  Yearly screening should:  Continue until it has been 15 years since you quit.  Stop if you develop a health problem that would prevent you from having lung cancer treatment.  Colorectal Cancer  This type of cancer can be detected and can often be prevented.  Routine colorectal cancer screening usually begins at  age 42 and continues through age 45.  If you have risk factors for colon cancer, your health care provider may recommend that you be screened at an earlier age.  If you have a family history of colorectal cancer, talk with your health care provider about genetic screening.  Your health care provider may also recommend using home test kits to check for hidden blood in your stool.  A small camera at the end of a tube can be used to examine your colon directly (sigmoidoscopy or colonoscopy). This is done to check for the earliest forms of colorectal cancer.  Direct examination of the colon should be repeated every  5-10 years until age 71. However, if early forms of precancerous polyps or small growths are found or if you have a family history or genetic risk for colorectal cancer, you may need to be screened more often.  Skin Cancer  Check your skin from head to toe regularly.  Monitor any moles. Be sure to tell your health care provider: ? About any new moles or changes in moles, especially if there is a change in a mole's shape or color. ? If you have a mole that is larger than the size of a pencil eraser.  If any of your family members has a history of skin cancer, especially at a young age, talk with your health care provider about genetic screening.  Always use sunscreen. Apply sunscreen liberally and repeatedly throughout the day.  Whenever you are outside, protect yourself by wearing long sleeves, pants, a wide-brimmed hat, and sunglasses.  What should I know about osteoporosis? Osteoporosis is a condition in which bone destruction happens more quickly than new bone creation. After menopause, you may be at an increased risk for osteoporosis. To help prevent osteoporosis or the bone fractures that can happen because of osteoporosis, the following is recommended:  If you are 46-71 years old, get at least 1,000 mg of calcium and at least 600 mg of vitamin D per day.  If you are older than age 55 but younger than age 65, get at least 1,200 mg of calcium and at least 600 mg of vitamin D per day.  If you are older than age 54, get at least 1,200 mg of calcium and at least 800 mg of vitamin D per day.  Smoking and excessive alcohol intake increase the risk of osteoporosis. Eat foods that are rich in calcium and vitamin D, and do weight-bearing exercises several times each week as directed by your health care provider. What should I know about how menopause affects my mental health? Depression may occur at any age, but it is more common as you become older. Common symptoms of depression  include:  Low or sad mood.  Changes in sleep patterns.  Changes in appetite or eating patterns.  Feeling an overall lack of motivation or enjoyment of activities that you previously enjoyed.  Frequent crying spells.  Talk with your health care provider if you think that you are experiencing depression. What should I know about immunizations? It is important that you get and maintain your immunizations. These include:  Tetanus, diphtheria, and pertussis (Tdap) booster vaccine.  Influenza every year before the flu season begins.  Pneumonia vaccine.  Shingles vaccine.  Your health care provider may also recommend other immunizations. This information is not intended to replace advice given to you by your health care provider. Make sure you discuss any questions you have with your health care provider. Document Released: 12/16/2005  Document Revised: 05/13/2016 Document Reviewed: 07/28/2015 [employer] Interactive Patient Education  2018 [employer]  

## 2017-05-24 ENCOUNTER — Ambulatory Visit (INDEPENDENT_AMBULATORY_CARE_PROVIDER_SITE_OTHER): Payer: BLUE CROSS/BLUE SHIELD

## 2017-05-24 DIAGNOSIS — Z1231 Encounter for screening mammogram for malignant neoplasm of breast: Secondary | ICD-10-CM | POA: Diagnosis not present

## 2018-04-19 ENCOUNTER — Other Ambulatory Visit: Payer: Self-pay | Admitting: Women's Health

## 2018-04-19 DIAGNOSIS — Z1239 Encounter for other screening for malignant neoplasm of breast: Secondary | ICD-10-CM

## 2018-05-29 ENCOUNTER — Encounter: Payer: BLUE CROSS/BLUE SHIELD | Admitting: Women's Health

## 2018-05-30 ENCOUNTER — Ambulatory Visit: Payer: Medicare Other

## 2018-06-08 ENCOUNTER — Ambulatory Visit (INDEPENDENT_AMBULATORY_CARE_PROVIDER_SITE_OTHER): Payer: BLUE CROSS/BLUE SHIELD

## 2018-06-08 DIAGNOSIS — Z1231 Encounter for screening mammogram for malignant neoplasm of breast: Secondary | ICD-10-CM

## 2018-06-08 DIAGNOSIS — Z1239 Encounter for other screening for malignant neoplasm of breast: Secondary | ICD-10-CM

## 2018-07-30 ENCOUNTER — Encounter: Payer: Self-pay | Admitting: Women's Health

## 2018-07-30 ENCOUNTER — Ambulatory Visit (INDEPENDENT_AMBULATORY_CARE_PROVIDER_SITE_OTHER): Payer: BLUE CROSS/BLUE SHIELD | Admitting: Women's Health

## 2018-07-30 VITALS — BP 124/82 | Ht 61.0 in | Wt 195.0 lb

## 2018-07-30 DIAGNOSIS — E038 Other specified hypothyroidism: Secondary | ICD-10-CM

## 2018-07-30 DIAGNOSIS — Z01419 Encounter for gynecological examination (general) (routine) without abnormal findings: Secondary | ICD-10-CM | POA: Diagnosis not present

## 2018-07-30 DIAGNOSIS — Z23 Encounter for immunization: Secondary | ICD-10-CM

## 2018-07-30 DIAGNOSIS — E039 Hypothyroidism, unspecified: Secondary | ICD-10-CM | POA: Insufficient documentation

## 2018-07-30 NOTE — Patient Instructions (Signed)
Health Maintenance for Postmenopausal Women Menopause is a normal process in which your reproductive ability comes to an end. This process happens gradually over a span of months to years, usually between the ages of 22 and 9. Menopause is complete when you have missed 12 consecutive menstrual periods. It is important to talk with your health care provider about some of the most common conditions that affect postmenopausal women, such as heart disease, cancer, and bone loss (osteoporosis). Adopting a healthy lifestyle and getting preventive care can help to promote your health and wellness. Those actions can also lower your chances of developing some of these common conditions. What should I know about menopause? During menopause, you may experience a number of symptoms, such as:  Moderate-to-severe hot flashes.  Night sweats.  Decrease in sex drive.  Mood swings.  Headaches.  Tiredness.  Irritability.  Memory problems.  Insomnia.  Choosing to treat or not to treat menopausal changes is an individual decision that you make with your health care provider. What should I know about hormone replacement therapy and supplements? Hormone therapy products are effective for treating symptoms that are associated with menopause, such as hot flashes and night sweats. Hormone replacement carries certain risks, especially as you become older. If you are thinking about using estrogen or estrogen with progestin treatments, discuss the benefits and risks with your health care provider. What should I know about heart disease and stroke? Heart disease, heart attack, and stroke become more likely as you age. This may be due, in part, to the hormonal changes that your body experiences during menopause. These can affect how your body processes dietary fats, triglycerides, and cholesterol. Heart attack and stroke are both medical emergencies. There are many things that you can do to help prevent heart disease  and stroke:  Have your blood pressure checked at least every 1-2 years. High blood pressure causes heart disease and increases the risk of stroke.  If you are 53-22 years old, ask your health care provider if you should take aspirin to prevent a heart attack or a stroke.  Do not use any tobacco products, including cigarettes, chewing tobacco, or electronic cigarettes. If you need help quitting, ask your health care provider.  It is important to eat a healthy diet and maintain a healthy weight. ? Be sure to include plenty of vegetables, fruits, low-fat dairy products, and lean protein. ? Avoid eating foods that are high in solid fats, added sugars, or salt (sodium).  Get regular exercise. This is one of the most important things that you can do for your health. ? Try to exercise for at least 150 minutes each week. The type of exercise that you do should increase your heart rate and make you sweat. This is known as moderate-intensity exercise. ? Try to do strengthening exercises at least twice each week. Do these in addition to the moderate-intensity exercise.  Know your numbers.Ask your health care provider to check your cholesterol and your blood glucose. Continue to have your blood tested as directed by your health care provider.  What should I know about cancer screening? There are several types of cancer. Take the following steps to reduce your risk and to catch any cancer development as early as possible. Breast Cancer  Practice breast self-awareness. ? This means understanding how your breasts normally appear and feel. ? It also means doing regular breast self-exams. Let your health care provider know about any changes, no matter how small.  If you are 40  or older, have a clinician do a breast exam (clinical breast exam or CBE) every year. Depending on your age, family history, and medical history, it may be recommended that you also have a yearly breast X-ray (mammogram).  If you  have a family history of breast cancer, talk with your health care provider about genetic screening.  If you are at high risk for breast cancer, talk with your health care provider about having an MRI and a mammogram every year.  Breast cancer (BRCA) gene test is recommended for women who have family members with BRCA-related cancers. Results of the assessment will determine the need for genetic counseling and BRCA1 and for BRCA2 testing. BRCA-related cancers include these types: ? Breast. This occurs in males or females. ? Ovarian. ? Tubal. This may also be called fallopian tube cancer. ? Cancer of the abdominal or pelvic lining (peritoneal cancer). ? Prostate. ? Pancreatic.  Cervical, Uterine, and Ovarian Cancer Your health care provider may recommend that you be screened regularly for cancer of the pelvic organs. These include your ovaries, uterus, and vagina. This screening involves a pelvic exam, which includes checking for microscopic changes to the surface of your cervix (Pap test).  For women ages 21-65, health care providers may recommend a pelvic exam and a Pap test every three years. For women ages 79-65, they may recommend the Pap test and pelvic exam, combined with testing for human papilloma virus (HPV), every five years. Some types of HPV increase your risk of cervical cancer. Testing for HPV may also be done on women of any age who have unclear Pap test results.  Other health care providers may not recommend any screening for nonpregnant women who are considered low risk for pelvic cancer and have no symptoms. Ask your health care provider if a screening pelvic exam is right for you.  If you have had past treatment for cervical cancer or a condition that could lead to cancer, you need Pap tests and screening for cancer for at least 20 years after your treatment. If Pap tests have been discontinued for you, your risk factors (such as having a new sexual partner) need to be  reassessed to determine if you should start having screenings again. Some women have medical problems that increase the chance of getting cervical cancer. In these cases, your health care provider may recommend that you have screening and Pap tests more often.  If you have a family history of uterine cancer or ovarian cancer, talk with your health care provider about genetic screening.  If you have vaginal bleeding after reaching menopause, tell your health care provider.  There are currently no reliable tests available to screen for ovarian cancer.  Lung Cancer Lung cancer screening is recommended for adults 69-62 years old who are at high risk for lung cancer because of a history of smoking. A yearly low-dose CT scan of the lungs is recommended if you:  Currently smoke.  Have a history of at least 30 pack-years of smoking and you currently smoke or have quit within the past 15 years. A pack-year is smoking an average of one pack of cigarettes per day for one year.  Yearly screening should:  Continue until it has been 15 years since you quit.  Stop if you develop a health problem that would prevent you from having lung cancer treatment.  Colorectal Cancer  This type of cancer can be detected and can often be prevented.  Routine colorectal cancer screening usually begins at  age 42 and continues through age 45.  If you have risk factors for colon cancer, your health care provider may recommend that you be screened at an earlier age.  If you have a family history of colorectal cancer, talk with your health care provider about genetic screening.  Your health care provider may also recommend using home test kits to check for hidden blood in your stool.  A small camera at the end of a tube can be used to examine your colon directly (sigmoidoscopy or colonoscopy). This is done to check for the earliest forms of colorectal cancer.  Direct examination of the colon should be repeated every  5-10 years until age 71. However, if early forms of precancerous polyps or small growths are found or if you have a family history or genetic risk for colorectal cancer, you may need to be screened more often.  Skin Cancer  Check your skin from head to toe regularly.  Monitor any moles. Be sure to tell your health care provider: ? About any new moles or changes in moles, especially if there is a change in a mole's shape or color. ? If you have a mole that is larger than the size of a pencil eraser.  If any of your family members has a history of skin cancer, especially at a Yahira Timberman age, talk with your health care provider about genetic screening.  Always use sunscreen. Apply sunscreen liberally and repeatedly throughout the day.  Whenever you are outside, protect yourself by wearing long sleeves, pants, a wide-brimmed hat, and sunglasses.  What should I know about osteoporosis? Osteoporosis is a condition in which bone destruction happens more quickly than new bone creation. After menopause, you may be at an increased risk for osteoporosis. To help prevent osteoporosis or the bone fractures that can happen because of osteoporosis, the following is recommended:  If you are 46-71 years old, get at least 1,000 mg of calcium and at least 600 mg of vitamin D per day.  If you are older than age 55 but younger than age 65, get at least 1,200 mg of calcium and at least 600 mg of vitamin D per day.  If you are older than age 54, get at least 1,200 mg of calcium and at least 800 mg of vitamin D per day.  Smoking and excessive alcohol intake increase the risk of osteoporosis. Eat foods that are rich in calcium and vitamin D, and do weight-bearing exercises several times each week as directed by your health care provider. What should I know about how menopause affects my mental health? Depression may occur at any age, but it is more common as you become older. Common symptoms of depression  include:  Low or sad mood.  Changes in sleep patterns.  Changes in appetite or eating patterns.  Feeling an overall lack of motivation or enjoyment of activities that you previously enjoyed.  Frequent crying spells.  Talk with your health care provider if you think that you are experiencing depression. What should I know about immunizations? It is important that you get and maintain your immunizations. These include:  Tetanus, diphtheria, and pertussis (Tdap) booster vaccine.  Influenza every year before the flu season begins.  Pneumonia vaccine.  Shingles vaccine.  Your health care provider may also recommend other immunizations. This information is not intended to replace advice given to you by your health care provider. Make sure you discuss any questions you have with your health care provider. Document Released: 12/16/2005  Document Revised: 05/13/2016 Document Reviewed: 07/28/2015 [employer] Interactive Patient Education  2018 [employer]  

## 2018-07-30 NOTE — Progress Notes (Signed)
[name] LericheCarla R [name] 03/13/1951 [id number]    History:    Presents for breast and pelvic exam.  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  Normal mammogram history.  2016 benign colon polyp 5-year follow-up.  Primary care manages bone density, 2019 T score -1.5 femoral neck stable, AP spine -2.3 FRAX 8.7% / 1%.    Vaccines current.  Primary care manages hypothyroidism, hypertension, hypercholesteremia, anxiety/depression, history of a stroke, has a tremor.  2019 husband had quadruple bypass doing okay.  Past medical history, past surgical history, family history and social history were all reviewed and documented in the EPIC chart.  Limited mobility, does walk independently, drives local, states mobility somewhat better than last year.  Walks on a treadmill most days of the week for 5 to 10 minutes.  Denies any falls.  Son's wife is due in December their first child.  ROS:  A ROS was performed and pertinent positives and negatives are included.  Exam:  Vitals:   07/30/18 1359  BP: 124/82  Weight: 195 lb (88.5 kg)  Height: 5\' 1"  (1.549 m)   Body mass index is 36.84 kg/m.   General appearance:  Normal Thyroid:  Symmetrical, normal in size, without palpable masses or nodularity. Respiratory  Auscultation:  Clear without wheezing or rhonchi Cardiovascular  Auscultation:  Regular rate, without rubs, murmurs or gallops  Edema/varicosities:  Not grossly evident Abdominal  Soft,nontender, without masses, guarding or rebound.  Liver/spleen:  No organomegaly noted  Hernia:  None appreciated  Skin  Inspection:  Grossly normal   Breasts: Examined lying and sitting.     Right: Without masses, retractions, discharge or axillary adenopathy.     Left: Without masses, retractions, discharge or axillary adenopathy. Gentitourinary   Inguinal/mons:  Normal without inguinal adenopathy  External genitalia:  Normal  BUS/Urethra/Skene's glands:  Normal  Vagina:  Normal  Cervix: And uterus absent  Adnexa/parametria:      Rt: Without masses or tenderness.   Lt: Without masses or tenderness.  Anus and perineum: Normal  Digital rectal exam: Normal sphincter tone without palpated masses or tenderness  Assessment/Plan:  67 y.o. MWF G1, P1 +1 adopted daughter breast and pelvic exam.   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  Home safety, fall prevention discussed.  SBE's, continue annual screening mammogram, calcium rich foods, vitamin D 2000 daily encouraged.    Harrington Challengerancy J Aubriegh Minch [facility] Center[facility]NP, 2:12 PM 07/30/2018

## 2019-07-25 ENCOUNTER — Other Ambulatory Visit: Payer: Self-pay | Admitting: Women's Health

## 2019-07-25 DIAGNOSIS — Z1231 Encounter for screening mammogram for malignant neoplasm of breast: Secondary | ICD-10-CM

## 2019-07-31 ENCOUNTER — Other Ambulatory Visit: Payer: Self-pay

## 2019-07-31 ENCOUNTER — Ambulatory Visit (INDEPENDENT_AMBULATORY_CARE_PROVIDER_SITE_OTHER): Payer: Medicare Other

## 2019-07-31 DIAGNOSIS — Z1231 Encounter for screening mammogram for malignant neoplasm of breast: Secondary | ICD-10-CM

## 2019-08-05 ENCOUNTER — Other Ambulatory Visit: Payer: Self-pay

## 2019-08-06 ENCOUNTER — Ambulatory Visit (INDEPENDENT_AMBULATORY_CARE_PROVIDER_SITE_OTHER): Payer: Medicare Other | Admitting: Women's Health

## 2019-08-06 ENCOUNTER — Encounter: Payer: Self-pay | Admitting: Women's Health

## 2019-08-06 VITALS — BP 108/66 | Ht 62.0 in | Wt 191.8 lb

## 2019-08-06 DIAGNOSIS — Z01419 Encounter for gynecological examination (general) (routine) without abnormal findings: Secondary | ICD-10-CM

## 2019-08-06 DIAGNOSIS — R1032 Left lower quadrant pain: Secondary | ICD-10-CM | POA: Diagnosis not present

## 2019-08-06 NOTE — Progress Notes (Addendum)
[name] [name] 01/18/1951 [id number]    History:    Presents for presents for breast and pelvic exam.  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  Normal Pap and mammogram history.  DEXA -1.5 primary care manages.  2016 benign colon polyp 5-year follow-up.  Vaccines current.  History of a stroke, primary care manages hypertension, hypothyroidism and anxiety/depression.  Tremor for many years and reports is having problems with memory, had some difficulty finding words.  Able to drive short distances around her home.  Not sexually active.  Past medical history, past surgical history, family history and social history were all reviewed and documented in the EPIC chart.  One son and 2 grandsons.    Husband had quadruple bypass surgery in 2019.  ROS:  A ROS was performed and pertinent positives and negatives are included.  Exam:  Vitals:   08/06/19 1408  BP: 108/66  Weight: 191 lb 12.8 oz (87 kg)  Height: 5\' 2"  (1.575 m)   Body mass index is 35.08 kg/m.   General appearance:  Normal, tremor Thyroid:  Symmetrical, normal in size, without palpable masses or nodularity. Respiratory  Auscultation:  Clear without wheezing or rhonchi Cardiovascular  Auscultation:  Regular rate, without rubs, murmurs or gallops  Edema/varicosities:  Not grossly evident Abdominal  Soft,nontender, without masses, guarding or rebound.  Liver/spleen:  No organomegaly noted  Hernia:  None appreciated  Skin  Inspection:  Grossly normal   Breasts: Examined lying and sitting.     Right: Without masses, retractions, discharge or axillary adenopathy.     Left: Without masses, retractions, discharge or axillary adenopathy. Gentitourinary   Inguinal/mons:  Normal without inguinal adenopathy  External genitalia:  Normal  BUS/Urethra/Skene's glands:  Normal  Vagina: Atrophic   Cervix: and uterus absent  Adnexa/parametria:     Rt: Without masses or tenderness.   Lt: Without masses or tenderness.  Anus and  perineum: Normal  Digital rectal exam: Normal sphincter tone without palpated masses or tenderness  Assessment/Plan:  68 y.o.MWF G1P1  for breast and pelvic exam with left lower quadrant pressure/pain for greater than 1 month .  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  Home safety, fall prevention discussed.  SBEs, continue annual screening mammogram, calcium rich foods, vitamin D 2000 daily encouraged.  Self-care, leisure activities encouraged.  Repeat colonoscopy next year, history of a benign polyp.  Aware of need to decrease calorie/carbs.  Schedule ultrasound to rule out ovarian problem reviewed most likely GI.      [name] [name], 2:13 PM 08/06/2019

## 2019-08-06 NOTE — Patient Instructions (Signed)
Health Maintenance After Age 68 After age 68, you are at a higher risk for certain long-term diseases and infections as well as injuries from falls. Falls are a major cause of broken bones and head injuries in people who are older than age 68. Getting regular preventive care can help to keep you healthy and well. Preventive care includes getting regular testing and making lifestyle changes as recommended by your health care provider. Talk with your health care provider about:  Which screenings and tests you should have. A screening is a test that checks for a disease when you have no symptoms.  A diet and exercise plan that is right for you. What should I know about screenings and tests to prevent falls? Screening and testing are the best ways to find a health problem early. Early diagnosis and treatment give you the best chance of managing medical conditions that are common after age 68. Certain conditions and lifestyle choices may make you more likely to have a fall. Your health care provider may recommend:  Regular vision checks. Poor vision and conditions such as cataracts can make you more likely to have a fall. If you wear glasses, make sure to get your prescription updated if your vision changes.  Medicine review. Work with your health care provider to regularly review all of the medicines you are taking, including over-the-counter medicines. Ask your health care provider about any side effects that may make you more likely to have a fall. Tell your health care provider if any medicines that you take make you feel dizzy or sleepy.  Osteoporosis screening. Osteoporosis is a condition that causes the bones to get weaker. This can make the bones weak and cause them to break more easily.  Blood pressure screening. Blood pressure changes and medicines to control blood pressure can make you feel dizzy.  Strength and balance checks. Your health care provider may recommend certain tests to check your  strength and balance while standing, walking, or changing positions.  Foot health exam. Foot pain and numbness, as well as not wearing proper footwear, can make you more likely to have a fall.  Depression screening. You may be more likely to have a fall if you have a fear of falling, feel emotionally low, or feel unable to do activities that you used to do.  Alcohol use screening. Using too much alcohol can affect your balance and may make you more likely to have a fall. What actions can I take to lower my risk of falls? General instructions  Talk with your health care provider about your risks for falling. Tell your health care provider if: ? You fall. Be sure to tell your health care provider about all falls, even ones that seem minor. ? You feel dizzy, sleepy, or off-balance.  Take over-the-counter and prescription medicines only as told by your health care provider. These include any supplements.  Eat a healthy diet and maintain a healthy weight. A healthy diet includes low-fat dairy products, low-fat (lean) meats, and fiber from whole grains, beans, and lots of fruits and vegetables. Home safety  Remove any tripping hazards, such as rugs, cords, and clutter.  Install safety equipment such as grab bars in bathrooms and safety rails on stairs.  Keep rooms and walkways well-lit. Activity   Follow a regular exercise program to stay fit. This will help you maintain your balance. Ask your health care provider what types of exercise are appropriate for you.  If you need a cane or   walker, use it as recommended by your health care provider.  Wear supportive shoes that have nonskid soles. Lifestyle  Do not drink alcohol if your health care provider tells you not to drink.  If you drink alcohol, limit how much you have: ? 0-1 drink a day for women. ? 0-2 drinks a day for men.  Be aware of how much alcohol is in your drink. In the [place] one drink equals one typical bottle of beer (12  oz), one-half glass of wine (5 oz), or one shot of hard liquor (1 oz).  Do not use any products that contain nicotine or tobacco, such as cigarettes and e-cigarettes. If you need help quitting, ask your health care provider. Summary  Having a healthy lifestyle and getting preventive care can help to protect your health and wellness after age 68.  Screening and testing are the best way to find a health problem early and help you avoid having a fall. Early diagnosis and treatment give you the best chance for managing medical conditions that are more common for people who are older than age 68.  Falls are a major cause of broken bones and head injuries in people who are older than age 68. Take precautions to prevent a fall at home.  Work with your health care provider to learn what changes you can make to improve your health and wellness and to prevent falls. This information is not intended to replace advice given to you by your health care provider. Make sure you discuss any questions you have with your health care provider. Document Released: 09/06/2017 Document Revised: 02/14/2019 Document Reviewed: 09/06/2017 Elsevier Patient Education  2020 [employer] Inc.  

## 2019-09-09 ENCOUNTER — Ambulatory Visit: Payer: Medicare Other | Admitting: Women's Health

## 2019-09-09 ENCOUNTER — Other Ambulatory Visit: Payer: Medicare Other

## 2019-09-16 ENCOUNTER — Other Ambulatory Visit: Payer: Self-pay

## 2019-09-17 ENCOUNTER — Encounter: Payer: Self-pay | Admitting: Women's Health

## 2019-09-17 ENCOUNTER — Other Ambulatory Visit: Payer: Self-pay

## 2019-09-17 ENCOUNTER — Ambulatory Visit (INDEPENDENT_AMBULATORY_CARE_PROVIDER_SITE_OTHER): Payer: Medicare Other

## 2019-09-17 ENCOUNTER — Ambulatory Visit (INDEPENDENT_AMBULATORY_CARE_PROVIDER_SITE_OTHER): Payer: Medicare Other | Admitting: Women's Health

## 2019-09-17 VITALS — BP 140/80

## 2019-09-17 DIAGNOSIS — R1032 Left lower quadrant pain: Secondary | ICD-10-CM

## 2019-09-17 NOTE — Progress Notes (Signed)
68 year old MWF G1 P1 presents for ultrasound.  At annual exam 08/06/2019 reported left lower quadrant discomfort rated at a 4-5 most days of the week.  Left lower quadrant pain persistent for greater than 2 months, does not relate to bowel movements, diet or activity.  Denies vaginal discharge, urinary symptoms, back pain or fever.  2000 TAH for DU B no HRT.  Questionable diverticulitis in the past.  2016 benign colon polyp on colonoscopy at [facility].  History significant for stroke, benign tremor, hypertension, hypothyroidism and anxiety/depression.  Having some difficulty finding words, ambulatory, reports drives short distances only.  Exam: Appears well, well groomed, ultrasound: Both ovaries small with atrophic appearance.  No adnexal masses.  No free fluid.  Area of bowel is noted in the left lower quadrant with hypoechoic walls suggestive of inflammation.  Persistent left lower quadrant pain  Plan: Reviewed normality of ovaries, not cause of left lower quadrant pain.  Possible IBS inflammation, best to follow-up with GI.  Unsure if she did have diverticulosis on last colonoscopy but states does think she may have had diverticulitis in the past.  Reviewed if it is diverticulitis antibiotic therapy is used.

## 2019-09-17 NOTE — Patient Instructions (Signed)
Diverticulitis  Diverticulitis is when small pockets in your large intestine (colon) get infected or swollen. This causes stomach pain and watery poop (diarrhea). These pouches are called diverticula. They form in people who have a condition called diverticulosis. Follow these instructions at home: Medicines  Take over-the-counter and prescription medicines only as told by your doctor. These include: ? Antibiotics. ? Pain medicines. ? Fiber pills. ? Probiotics. ? Stool softeners.  Do not drive or use heavy machinery while taking prescription pain medicine.  If you were prescribed an antibiotic, take it as told. Do not stop taking it even if you feel better. General instructions   Follow a diet as told by your doctor.  When you feel better, your doctor may tell you to change your diet. You may need to eat a lot of fiber. Fiber makes it easier to poop (have bowel movements). Healthy foods with fiber include: ? Berries. ? Beans. ? Lentils. ? Green vegetables.  Exercise 3 or more times a week. Aim for 30 minutes each time. Exercise enough to sweat and make your heart beat faster.  Keep all follow-up visits as told. This is important. You may need to have an exam of the large intestine. This is called a colonoscopy. Contact a doctor if:  Your pain does not get better.  You have a hard time eating or drinking.  You are not pooping like normal. Get help right away if:  Your pain gets worse.  Your problems do not get better.  Your problems get worse very fast.  You have a fever.  You throw up (vomit) more than one time.  You have poop that is: ? Bloody. ? Black. ? Tarry. Summary  Diverticulitis is when small pockets in your large intestine (colon) get infected or swollen.  Take medicines only as told by your doctor.  Follow a diet as told by your doctor. This information is not intended to replace advice given to you by your health care provider. Make sure you  discuss any questions you have with your health care provider. Document Released: 04/11/2008 Document Revised: 10/06/2017 Document Reviewed: 11/10/2016 Elsevier Patient Education  2020 Elsevier Inc. Abdominal Pain, Adult  Many things can cause belly (abdominal) pain. Most times, belly pain is not dangerous. Many cases of belly pain can be watched and treated at home. Sometimes belly pain is serious, though. Your doctor will try to find the cause of your belly pain. Follow these instructions at home:  Take over-the-counter and prescription medicines only as told by your doctor. Do not take medicines that help you poop (laxatives) unless told to by your doctor.  Drink enough fluid to keep your pee (urine) clear or pale yellow.  Watch your belly pain for any changes.  Keep all follow-up visits as told by your doctor. This is important. Contact a doctor if:  Your belly pain changes or gets worse.  You are not hungry, or you lose weight without trying.  You are having trouble pooping (constipated) or have watery poop (diarrhea) for more than 2-3 days.  You have pain when you pee or poop.  Your belly pain wakes you up at night.  Your pain gets worse with meals, after eating, or with certain foods.  You are throwing up and cannot keep anything down.  You have a fever. Get help right away if:  Your pain does not go away as soon as your doctor says it should.  You cannot stop throwing up.  Your pain  is only in areas of your belly, such as the right side or the left lower part of the belly.  You have bloody or black poop, or poop that looks like tar.  You have very bad pain, cramping, or bloating in your belly.  You have signs of not having enough fluid or water in your body (dehydration), such as: ? Dark pee, very little pee, or no pee. ? Cracked lips. ? Dry mouth. ? Sunken eyes. ? Sleepiness. ? Weakness. This information is not intended to replace advice given to you by  your health care provider. Make sure you discuss any questions you have with your health care provider. Document Released: 04/11/2008 Document Revised: 05/13/2016 Document Reviewed: 04/06/2016 Elsevier Interactive Patient Education  [employer].

## 2019-10-19 ENCOUNTER — Encounter (HOSPITAL_BASED_OUTPATIENT_CLINIC_OR_DEPARTMENT_OTHER): Payer: Self-pay

## 2019-10-19 ENCOUNTER — Other Ambulatory Visit: Payer: Self-pay

## 2019-10-19 ENCOUNTER — Emergency Department (HOSPITAL_BASED_OUTPATIENT_CLINIC_OR_DEPARTMENT_OTHER): Payer: Medicare Other

## 2019-10-19 ENCOUNTER — Inpatient Hospital Stay (HOSPITAL_BASED_OUTPATIENT_CLINIC_OR_DEPARTMENT_OTHER)
Admission: EM | Admit: 2019-10-19 | Discharge: 2019-10-22 | DRG: 292 | Disposition: A | Discharge status: Home / Self Care | Payer: Medicare Other | Source: Ambulatory Visit | Attending: Family Medicine | Admitting: Family Medicine

## 2019-10-19 DIAGNOSIS — Z79890 Hormone replacement therapy (postmenopausal): Secondary | ICD-10-CM

## 2019-10-19 DIAGNOSIS — Z90710 Acquired absence of both cervix and uterus: Secondary | ICD-10-CM | POA: Diagnosis not present

## 2019-10-19 DIAGNOSIS — I5031 Acute diastolic (congestive) heart failure: Secondary | ICD-10-CM | POA: Diagnosis not present

## 2019-10-19 DIAGNOSIS — R072 Precordial pain: Secondary | ICD-10-CM | POA: Diagnosis present

## 2019-10-19 DIAGNOSIS — R911 Solitary pulmonary nodule: Secondary | ICD-10-CM | POA: Diagnosis present

## 2019-10-19 DIAGNOSIS — I509 Heart failure, unspecified: Secondary | ICD-10-CM | POA: Insufficient documentation

## 2019-10-19 DIAGNOSIS — Z881 Allergy status to other antibiotic agents status: Secondary | ICD-10-CM

## 2019-10-19 DIAGNOSIS — Z833 Family history of diabetes mellitus: Secondary | ICD-10-CM | POA: Diagnosis not present

## 2019-10-19 DIAGNOSIS — F411 Generalized anxiety disorder: Secondary | ICD-10-CM | POA: Diagnosis present

## 2019-10-19 DIAGNOSIS — Z8673 Personal history of transient ischemic attack (TIA), and cerebral infarction without residual deficits: Secondary | ICD-10-CM

## 2019-10-19 DIAGNOSIS — F419 Anxiety disorder, unspecified: Secondary | ICD-10-CM | POA: Diagnosis not present

## 2019-10-19 DIAGNOSIS — G4730 Sleep apnea, unspecified: Secondary | ICD-10-CM | POA: Diagnosis present

## 2019-10-19 DIAGNOSIS — E7800 Pure hypercholesterolemia, unspecified: Secondary | ICD-10-CM | POA: Diagnosis present

## 2019-10-19 DIAGNOSIS — I5043 Acute on chronic combined systolic (congestive) and diastolic (congestive) heart failure: Secondary | ICD-10-CM | POA: Diagnosis present

## 2019-10-19 DIAGNOSIS — F329 Major depressive disorder, single episode, unspecified: Secondary | ICD-10-CM | POA: Diagnosis not present

## 2019-10-19 DIAGNOSIS — F0390 Unspecified dementia without behavioral disturbance: Secondary | ICD-10-CM | POA: Diagnosis present

## 2019-10-19 DIAGNOSIS — I5033 Acute on chronic diastolic (congestive) heart failure: Secondary | ICD-10-CM | POA: Diagnosis not present

## 2019-10-19 DIAGNOSIS — Z888 Allergy status to other drugs, medicaments and biological substances status: Secondary | ICD-10-CM

## 2019-10-19 DIAGNOSIS — E038 Other specified hypothyroidism: Secondary | ICD-10-CM | POA: Diagnosis not present

## 2019-10-19 DIAGNOSIS — I10 Essential (primary) hypertension: Secondary | ICD-10-CM | POA: Diagnosis present

## 2019-10-19 DIAGNOSIS — Z96652 Presence of left artificial knee joint: Secondary | ICD-10-CM | POA: Diagnosis present

## 2019-10-19 DIAGNOSIS — I110 Hypertensive heart disease with heart failure: Principal | ICD-10-CM | POA: Diagnosis present

## 2019-10-19 DIAGNOSIS — J449 Chronic obstructive pulmonary disease, unspecified: Secondary | ICD-10-CM | POA: Diagnosis present

## 2019-10-19 DIAGNOSIS — F339 Major depressive disorder, recurrent, unspecified: Secondary | ICD-10-CM | POA: Diagnosis present

## 2019-10-19 DIAGNOSIS — K219 Gastro-esophageal reflux disease without esophagitis: Secondary | ICD-10-CM | POA: Diagnosis present

## 2019-10-19 DIAGNOSIS — E876 Hypokalemia: Secondary | ICD-10-CM | POA: Diagnosis present

## 2019-10-19 DIAGNOSIS — Z7902 Long term (current) use of antithrombotics/antiplatelets: Secondary | ICD-10-CM | POA: Diagnosis not present

## 2019-10-19 DIAGNOSIS — E669 Obesity, unspecified: Secondary | ICD-10-CM | POA: Diagnosis present

## 2019-10-19 DIAGNOSIS — Z20828 Contact with and (suspected) exposure to other viral communicable diseases: Secondary | ICD-10-CM | POA: Diagnosis present

## 2019-10-19 DIAGNOSIS — F32A Depression, unspecified: Secondary | ICD-10-CM | POA: Diagnosis present

## 2019-10-19 DIAGNOSIS — Z9119 Patient's noncompliance with other medical treatment and regimen: Secondary | ICD-10-CM

## 2019-10-19 DIAGNOSIS — R9431 Abnormal electrocardiogram [ECG] [EKG]: Secondary | ICD-10-CM | POA: Diagnosis not present

## 2019-10-19 DIAGNOSIS — Z8249 Family history of ischemic heart disease and other diseases of the circulatory system: Secondary | ICD-10-CM | POA: Diagnosis not present

## 2019-10-19 DIAGNOSIS — Z7951 Long term (current) use of inhaled steroids: Secondary | ICD-10-CM

## 2019-10-19 DIAGNOSIS — R001 Bradycardia, unspecified: Secondary | ICD-10-CM | POA: Diagnosis present

## 2019-10-19 DIAGNOSIS — Z885 Allergy status to narcotic agent status: Secondary | ICD-10-CM

## 2019-10-19 DIAGNOSIS — E785 Hyperlipidemia, unspecified: Secondary | ICD-10-CM | POA: Diagnosis present

## 2019-10-19 DIAGNOSIS — E039 Hypothyroidism, unspecified: Secondary | ICD-10-CM | POA: Diagnosis present

## 2019-10-19 DIAGNOSIS — Z79899 Other long term (current) drug therapy: Secondary | ICD-10-CM

## 2019-10-19 DIAGNOSIS — Z79891 Long term (current) use of opiate analgesic: Secondary | ICD-10-CM

## 2019-10-19 DIAGNOSIS — Z9111 Patient's noncompliance with dietary regimen: Secondary | ICD-10-CM

## 2019-10-19 DIAGNOSIS — Z6833 Body mass index (BMI) 33.0-33.9, adult: Secondary | ICD-10-CM

## 2019-10-19 LAB — CBC WITH DIFFERENTIAL/PLATELET
Abs Immature Granulocytes: 0.03 10*3/uL (ref 0.00–0.07)
Basophils Absolute: 0 10*3/uL (ref 0.0–0.1)
Basophils Relative: 1 %
Eosinophils Absolute: 0.1 10*3/uL (ref 0.0–0.5)
Eosinophils Relative: 1 %
HCT: 34.8 % — ABNORMAL LOW (ref 36.0–46.0)
Hemoglobin: 11.2 g/dL — ABNORMAL LOW (ref 12.0–15.0)
Immature Granulocytes: 0 %
Lymphocytes Relative: 12 %
Lymphs Abs: 0.9 10*3/uL (ref 0.7–4.0)
MCH: 27.6 pg (ref 26.0–34.0)
MCHC: 32.2 g/dL (ref 30.0–36.0)
MCV: 85.7 fL (ref 80.0–100.0)
Monocytes Absolute: 0.7 10*3/uL (ref 0.1–1.0)
Monocytes Relative: 9 %
Neutro Abs: 5.8 10*3/uL (ref 1.7–7.7)
Neutrophils Relative %: 77 %
Platelets: 155 10*3/uL (ref 150–400)
RBC: 4.06 MIL/uL (ref 3.87–5.11)
RDW: 13.5 % (ref 11.5–15.5)
WBC: 7.5 10*3/uL (ref 4.0–10.5)
nRBC: 0 % (ref 0.0–0.2)

## 2019-10-19 LAB — HIV ANTIBODY (ROUTINE TESTING W REFLEX): HIV Screen 4th Generation wRfx: NONREACTIVE

## 2019-10-19 LAB — COMPREHENSIVE METABOLIC PANEL
ALT: 8 U/L (ref 0–44)
AST: 11 U/L — ABNORMAL LOW (ref 15–41)
Albumin: 4.3 g/dL (ref 3.5–5.0)
Alkaline Phosphatase: 91 U/L (ref 38–126)
Anion gap: 7 (ref 5–15)
BUN: 11 mg/dL (ref 8–23)
CO2: 23 mmol/L (ref 22–32)
Calcium: 8.8 mg/dL — ABNORMAL LOW (ref 8.9–10.3)
Chloride: 107 mmol/L (ref 98–111)
Creatinine, Ser: 0.92 mg/dL (ref 0.44–1.00)
GFR calc Af Amer: >60 mL/min (ref >60)
GFR calc non Af Amer: >60 mL/min (ref >60)
Glucose, Bld: 90 mg/dL (ref 70–99)
Potassium: 3.6 mmol/L (ref 3.5–5.1)
Sodium: 137 mmol/L (ref 135–145)
Total Bilirubin: 1.2 mg/dL (ref 0.3–1.2)
Total Protein: 7.1 g/dL (ref 6.5–8.1)

## 2019-10-19 LAB — SARS CORONAVIRUS 2 AG (30 MIN TAT): SARS Coronavirus 2 Ag: NEGATIVE

## 2019-10-19 LAB — SARS CORONAVIRUS 2 (TAT 6-24 HRS): SARS Coronavirus 2: NEGATIVE

## 2019-10-19 LAB — BRAIN NATRIURETIC PEPTIDE: B Natriuretic Peptide: 552.4 pg/mL — ABNORMAL HIGH (ref 0.0–100.0)

## 2019-10-19 LAB — TROPONIN I (HIGH SENSITIVITY)
Troponin I (High Sensitivity): 30 ng/L — ABNORMAL HIGH (ref <18)
Troponin I (High Sensitivity): 33 ng/L — ABNORMAL HIGH (ref <18)
Troponin I (High Sensitivity): 55 ng/L — ABNORMAL HIGH (ref <18)
Troponin I (High Sensitivity): 65 ng/L — ABNORMAL HIGH (ref <18)

## 2019-10-19 MED ORDER — ATORVASTATIN CALCIUM 10 MG PO TABS
20.0000 mg | ORAL_TABLET | Freq: Every day | ORAL | Status: DC
  Administered 2019-10-19 – 2019-10-22 (×4): 20 mg via ORAL
  Filled 2019-10-19 (×4): qty 2

## 2019-10-19 MED ORDER — MINOXIDIL 10 MG PO TABS
10.0000 mg | ORAL_TABLET | Freq: Every day | ORAL | Status: DC
  Administered 2019-10-19 – 2019-10-22 (×4): 10 mg via ORAL
  Filled 2019-10-19 (×4): qty 1

## 2019-10-19 MED ORDER — TRAMADOL HCL 50 MG PO TABS
50.0000 mg | ORAL_TABLET | Freq: Four times a day (QID) | ORAL | Status: DC | PRN
  Administered 2019-10-19 – 2019-10-21 (×2): 50 mg via ORAL
  Filled 2019-10-19 (×2): qty 1

## 2019-10-19 MED ORDER — CLOPIDOGREL BISULFATE 75 MG PO TABS
75.0000 mg | ORAL_TABLET | Freq: Every day | ORAL | Status: DC
  Administered 2019-10-19 – 2019-10-22 (×4): 75 mg via ORAL
  Filled 2019-10-19 (×4): qty 1

## 2019-10-19 MED ORDER — GABAPENTIN 300 MG PO CAPS
300.0000 mg | ORAL_CAPSULE | Freq: Two times a day (BID) | ORAL | Status: DC
  Administered 2019-10-20 – 2019-10-22 (×5): 300 mg via ORAL
  Filled 2019-10-19 (×5): qty 1

## 2019-10-19 MED ORDER — HYDROCORTISONE (PERIANAL) 2.5 % EX CREA
1.0000 "application " | TOPICAL_CREAM | Freq: Two times a day (BID) | CUTANEOUS | Status: DC | PRN
  Filled 2019-10-19: qty 28.35

## 2019-10-19 MED ORDER — SERTRALINE HCL 100 MG PO TABS
100.0000 mg | ORAL_TABLET | Freq: Every day | ORAL | Status: DC
  Administered 2019-10-19 – 2019-10-22 (×4): 100 mg via ORAL
  Filled 2019-10-19 (×4): qty 1

## 2019-10-19 MED ORDER — HEPARIN (PORCINE) 25000 UT/250ML-% IV SOLN
1350.0000 [IU]/h | INTRAVENOUS | Status: DC
  Administered 2019-10-19: 850 [IU]/h via INTRAVENOUS
  Administered 2019-10-20: 1050 [IU]/h via INTRAVENOUS
  Administered 2019-10-22: 1350 [IU]/h via INTRAVENOUS
  Filled 2019-10-19 (×4): qty 250

## 2019-10-19 MED ORDER — IPRATROPIUM-ALBUTEROL 0.5-2.5 (3) MG/3ML IN SOLN: 3.0000 mL | Freq: Four times a day (QID) | RESPIRATORY_TRACT | Status: DC | PRN

## 2019-10-19 MED ORDER — ONDANSETRON HCL 4 MG/2ML IJ SOLN
4.0000 mg | Freq: Four times a day (QID) | INTRAMUSCULAR | Status: DC | PRN
  Administered 2019-10-19: 4 mg via INTRAVENOUS
  Filled 2019-10-19: qty 2

## 2019-10-19 MED ORDER — ACETAMINOPHEN 325 MG PO TABS: 650.0000 mg | ORAL_TABLET | ORAL | Status: DC | PRN

## 2019-10-19 MED ORDER — FUROSEMIDE 10 MG/ML IJ SOLN
40.0000 mg | Freq: Once | INTRAMUSCULAR | Status: AC
  Administered 2019-10-19: 40 mg via INTRAVENOUS
  Filled 2019-10-19: qty 4

## 2019-10-19 MED ORDER — ENOXAPARIN SODIUM 40 MG/0.4ML ~~LOC~~ SOLN
40.0000 mg | SUBCUTANEOUS | Status: DC
  Administered 2019-10-19: 40 mg via SUBCUTANEOUS
  Filled 2019-10-19: qty 0.4

## 2019-10-19 MED ORDER — ISOSORBIDE MONONITRATE ER 60 MG PO TB24
60.0000 mg | ORAL_TABLET | Freq: Every day | ORAL | Status: DC
  Administered 2019-10-19: 60 mg via ORAL
  Filled 2019-10-19: qty 1

## 2019-10-19 MED ORDER — FUROSEMIDE 10 MG/ML IJ SOLN
40.0000 mg | Freq: Two times a day (BID) | INTRAMUSCULAR | Status: DC
  Administered 2019-10-19 – 2019-10-21 (×4): 40 mg via INTRAVENOUS
  Filled 2019-10-19 (×4): qty 4

## 2019-10-19 MED ORDER — LEVOTHYROXINE SODIUM 50 MCG PO TABS
50.0000 ug | ORAL_TABLET | Freq: Every day | ORAL | Status: DC
  Administered 2019-10-20 – 2019-10-22 (×3): 50 ug via ORAL
  Filled 2019-10-19 (×3): qty 1

## 2019-10-19 MED ORDER — NITROGLYCERIN IN D5W 200-5 MCG/ML-% IV SOLN
2.0000 ug/min | INTRAVENOUS | Status: DC
  Administered 2019-10-19: 5 ug/min via INTRAVENOUS
  Filled 2019-10-19: qty 250

## 2019-10-19 MED ORDER — LABETALOL HCL 200 MG PO TABS: 500.0000 mg | ORAL_TABLET | Freq: Two times a day (BID) | ORAL | Status: DC

## 2019-10-19 MED ORDER — TOPIRAMATE 25 MG PO TABS
50.0000 mg | ORAL_TABLET | Freq: Two times a day (BID) | ORAL | Status: DC
  Administered 2019-10-19 – 2019-10-22 (×6): 50 mg via ORAL
  Filled 2019-10-19 (×7): qty 2

## 2019-10-19 MED ORDER — LABETALOL HCL 200 MG PO TABS
250.0000 mg | ORAL_TABLET | Freq: Two times a day (BID) | ORAL | Status: DC
  Administered 2019-10-20 – 2019-10-22 (×5): 250 mg via ORAL
  Filled 2019-10-19 (×5): qty 1

## 2019-10-19 MED ORDER — MOMETASONE FURO-FORMOTEROL FUM 200-5 MCG/ACT IN AERO
2.0000 | INHALATION_SPRAY | Freq: Two times a day (BID) | RESPIRATORY_TRACT | Status: DC
  Filled 2019-10-19: qty 8.8

## 2019-10-19 MED ORDER — PRIMIDONE 50 MG PO TABS
50.0000 mg | ORAL_TABLET | Freq: Every day | ORAL | Status: DC
  Administered 2019-10-19 – 2019-10-21 (×3): 50 mg via ORAL
  Filled 2019-10-19 (×4): qty 1

## 2019-10-19 MED ORDER — GABAPENTIN 300 MG PO CAPS: 300.0000 mg | ORAL_CAPSULE | Freq: Two times a day (BID) | ORAL | Status: DC

## 2019-10-19 MED ORDER — LEVOTHYROXINE SODIUM 25 MCG PO TABS: 25.0000 ug | ORAL_TABLET | Freq: Every day | ORAL | Status: DC

## 2019-10-19 MED ORDER — TOPIRAMATE 100 MG PO TABS
100.0000 mg | ORAL_TABLET | Freq: Two times a day (BID) | ORAL | Status: DC
  Administered 2019-10-19: 100 mg via ORAL
  Filled 2019-10-19: qty 1

## 2019-10-19 MED ORDER — GABAPENTIN 300 MG PO CAPS
600.0000 mg | ORAL_CAPSULE | Freq: Every day | ORAL | Status: DC
  Administered 2019-10-19 – 2019-10-21 (×3): 600 mg via ORAL
  Filled 2019-10-19 (×3): qty 2

## 2019-10-19 MED ORDER — SODIUM CHLORIDE 0.9% FLUSH
3.0000 mL | Freq: Two times a day (BID) | INTRAVENOUS | Status: DC
  Administered 2019-10-19 – 2019-10-21 (×3): 3 mL via INTRAVENOUS

## 2019-10-19 MED ORDER — FLUTICASONE FUROATE-VILANTEROL 100-25 MCG/INH IN AEPB
1.0000 | INHALATION_SPRAY | Freq: Every day | RESPIRATORY_TRACT | Status: DC
  Administered 2019-10-19 – 2019-10-22 (×4): 1 via RESPIRATORY_TRACT
  Filled 2019-10-19: qty 28

## 2019-10-19 MED ORDER — DONEPEZIL HCL 10 MG PO TABS
10.0000 mg | ORAL_TABLET | Freq: Every day | ORAL | Status: DC
  Filled 2019-10-19 (×2): qty 1

## 2019-10-19 MED ORDER — SODIUM CHLORIDE 0.9 % IV SOLN: 250.0000 mL | INTRAVENOUS | Status: DC | PRN

## 2019-10-19 MED ORDER — SODIUM CHLORIDE 0.9% FLUSH: 3.0000 mL | INTRAVENOUS | Status: DC | PRN

## 2019-10-19 MED ORDER — LOSARTAN POTASSIUM 50 MG PO TABS
50.0000 mg | ORAL_TABLET | Freq: Every day | ORAL | Status: DC
  Administered 2019-10-19 – 2019-10-22 (×4): 50 mg via ORAL
  Filled 2019-10-19 (×4): qty 1

## 2019-10-19 MED ORDER — ALPRAZOLAM 0.5 MG PO TABS
0.5000 mg | ORAL_TABLET | Freq: Every evening | ORAL | Status: DC | PRN
  Administered 2019-10-20 – 2019-10-21 (×2): 0.5 mg via ORAL
  Filled 2019-10-19 (×2): qty 1

## 2019-10-19 NOTE — Consult Note (Signed)
CARDIOLOGY CONSULT NOTE  Patient ID: [name] LericheCarla R [name] MRN: [id number] DOB/AGE: 68/12/1950 68 y.o.  Admit date: 10/19/2019 Referring Physician: [facility] Reason for Consultation:  Chest pain, shortness of breath  HPI:   68 y.o. Caucasian female  with hypertension, hyperlipidemia, history of stroke, HFpEF, COPD, hypothyroidism, [facility] for management of chest pain or shortness of breath.  Patient presented to med Center chest pain shortness of breath that began a day prior.  Chest pain is sharp, intermittent, worse with deep breathing. Nitroglycerin did not help the pain. Patient has been hypertensive with blood pressure of up to 180/76 mmHg.Work-up so far has showed mildly elevated high-sensitivity troponin in 30s, BNP elevated at 552, and chest x-ray showing interstitial edema vs pneumonitis.   [facility]  She was last seen by them in August 2019 for management ofHFpEF and hypertension. At home, she is on following cardiac meds:  labetalol 200 mg bid, Imdur 30 mg daily, plavix 75 mg daily, atorvastatin 20 mg daily, lasix 40 mg daily. She is compliant with medications, but admits to dietary noncompliance.   Shortness of breath has improved with lasix, chest pain persists.   Past Medical History:  Diagnosis Date  . Angioedema   . Depression   . Edema   . HLD (hyperlipidemia)   . HTN (hypertension)   . Mild renal insufficiency   . Mini stroke (HCC)   . Osteopenia    -1.8 03/2010  . Pulmonary nodule   . Sleep apnea    has cpap machine x non compliant due to "panic". Managed by Dr. Jobe MarkerMike Litica n post viral reactive cough periodically since 2002  . Stroke (HCC) 2015  . Thyroid disease   . Tremor    of unclear etiology  . VAIN I (vaginal intraepithelial neoplasia grade I) 2009     Past Surgical History:  Procedure Laterality Date  . ABDOMINAL HYSTERECTOMY  2000   TAH  . bilateral tube ligation    . BROW  LIFT    . HERNIA REPAIR    . hysterectomy other)    . left knee replacement    . post lumpectomy    . stamey procedure    . WRIST FRACTURE SURGERY  1/12     Family History  Problem Relation Age of Onset  . COPD Father        17% capacity  . Heart attack Father        in his 3650s  . Diabetes Father   . Hypertension Father   . Hypertension Mother   . Diabetes Mother   . Heart attack Mother   . Alcohol abuse Maternal Grandfather   . Alcohol abuse Paternal Grandfather   . Seizures Neg Hx      Social History: Social History   Socioeconomic History  . Marital status: Married    Spouse name: Not on file  . Number of children: Not on file  . Years of education: Not on file  . Highest education level: Not on file  Occupational History  . Not on file  Tobacco Use  . Smoking status: Never Smoker  . Smokeless tobacco: Never Used  . Tobacco comment: nonsmoker. Passive 1st 20 years of life   Substance and Sexual Activity  . Alcohol use: Yes    Alcohol/week: 0.5 standard drinks    Types: 1 Standard drinks or equivalent per week    Comment: rare  . Drug use: No  Comment: Coffee  Caffeinated Beverages 24 ounces per day.  Marland Kitchen Sexual activity: Yes    Partners: Male  Other Topics Concern  . Not on file  Social History Narrative   Not involved in exercise program and she is post meopausal. 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 Difficulty of Paying Living Expenses: Not on file  Food Insecurity:   . Worried About Charity fundraiser in the Last Year: Not on file  . Ran Out of Food in the Last Year: Not on file  Transportation Needs:   . Lack of Transportation (Medical): Not on file  . Lack of Transportation (Non-Medical): Not on file  Physical Activity:   . Days of Exercise per Week: Not on file  . Minutes of Exercise per Session: Not on file  Stress:   . Feeling of Stress : Not on file   Social Connections:   . Frequency of Communication with Friends and Family: Not on file  . Frequency of Social Gatherings with Friends and Family: Not on file  . Attends Religious Services: Not on file  . Active Member of Clubs or Organizations: Not on file  . Attends Archivist Meetings: Not on file  . Marital Status: Not on file  Intimate Partner Violence:   . Fear of Current or Ex-Partner: Not on file  . Emotionally Abused: Not on file  . Physically Abused: Not on file  . Sexually Abused: Not on file     Medications Prior to Admission  Medication Sig Dispense Refill Last Dose  . albuterol (PROVENTIL HFA;VENTOLIN HFA) 108 (90 BASE) MCG/ACT inhaler Inhale 2 puffs into the lungs every 6 (six) hours as needed for wheezing.     Marland Kitchen ALPRAZolam (XANAX) 0.5 MG tablet Take 0.5 mg by mouth at bedtime as needed.      Marland Kitchen antipyrine-benzocaine (AURALGAN) otic solution Reported on 05/04/2016     . atorvastatin (LIPITOR) 20 MG tablet Take 20 mg by mouth daily.     . cetirizine (ZYRTEC) 10 MG tablet Take 10 mg by mouth daily.     . clopidogrel (PLAVIX) 75 MG tablet Take 75 mg by mouth daily.     Marland Kitchen EPIPEN 2-PAK 0.3 MG/0.3ML SOAJ injection      . Fluticasone-Salmeterol (ADVAIR) 500-50 MCG/DOSE AEPB Inhale 1 puff into the lungs every 12 (twelve) hours.     . furosemide (LASIX) 40 MG tablet Take 40 mg by mouth.     . gabapentin (NEURONTIN) 100 MG capsule Take 300 mg by mouth 3 (three) times daily. 300mg  in the morning and noon. And 600mg  at bedtime     . isosorbide mononitrate (IMDUR) 30 MG 24 hr tablet      . labetalol (NORMODYNE) 200 MG tablet Take 500 mg by mouth 2 (two) times daily.      Marland Kitchen levothyroxine (SYNTHROID, LEVOTHROID) 25 MCG tablet Take 25 mcg by mouth daily before breakfast.     . minoxidil (LONITEN) 10 MG tablet Take 10 mg by mouth daily.      . nitroGLYCERIN (NITROSTAT) 0.4 MG SL tablet Place 0.4 mg under the tongue every 5 (five) minutes as needed for chest pain.     .  pantoprazole (PROTONIX) 40 MG tablet Take 40 mg by mouth daily.     . sertraline (ZOLOFT) 100 MG tablet Take 1 tablet (100 mg total) by  mouth daily. 30 tablet 1   . topiramate (TOPAMAX) 100 MG tablet Take 100 mg by mouth 2 (two) times daily.     . traMADol (ULTRAM) 50 MG tablet Take 50 mg by mouth every 6 (six) hours as needed.       Review of Systems  Constitution: Negative for decreased appetite, malaise/fatigue, weight gain and weight loss.  HENT: Negative for congestion.   Eyes: Negative for visual disturbance.  Cardiovascular: Positive for chest pain, dyspnea on exertion and leg swelling. Negative for palpitations and syncope.  Respiratory: Positive for shortness of breath. Negative for cough.   Endocrine: Negative for cold intolerance.  Hematologic/Lymphatic: Does not bruise/bleed easily.  Skin: Negative for itching and rash.  Musculoskeletal: Negative for myalgias.  Gastrointestinal: Negative for abdominal pain, nausea and vomiting.  Genitourinary: Negative for dysuria.  Neurological: Negative for dizziness and weakness.  Psychiatric/Behavioral: The patient is not nervous/anxious.   All other systems reviewed and are negative.     Physical Exam: Physical Exam  Constitutional: She is oriented to person, place, and time. She appears well-developed and well-nourished. No distress.  HENT:  Head: Normocephalic and atraumatic.  Eyes: Pupils are equal, round, and reactive to light. Conjunctivae are normal.  Neck: JVD present.  Cardiovascular: Normal rate, regular rhythm and intact distal pulses.  No murmur heard. Pulmonary/Chest: Effort normal and breath sounds normal. She has no wheezes. She has no rales.  Abdominal: Soft. Bowel sounds are normal. There is no rebound.  Musculoskeletal:        General: Edema (Trace b/l) present.  Lymphadenopathy:    She has no cervical adenopathy.  Neurological: She is alert and oriented to person, place, and time. No cranial nerve deficit.   Skin: Skin is warm and dry.  Psychiatric: She has a normal mood and affect.  Nursing note and vitals reviewed.    Labs:   Lab Results  Component Value Date   WBC 7.5 10/19/2019   HGB 11.2 (L) 10/19/2019   HCT 34.8 (L) 10/19/2019   MCV 85.7 10/19/2019   PLT 155 10/19/2019    Recent Labs  Lab 10/19/19 0758  NA 137  K 3.6  CL 107  CO2 23  BUN 11  CREATININE 0.92  CALCIUM 8.8*  PROT 7.1  BILITOT 1.2  ALKPHOS 91  ALT 8  AST 11*  GLUCOSE 90    Lipid Panel     Component Value Date/Time   CHOL  08/13/2008 0615    165        ATP III CLASSIFICATION:  <200     mg/dL   Desirable  771-165  mg/dL   Borderline High  >=790    mg/dL   High   TRIG 383 (H) 33/83/2919 0615   HDL 28 (L) 08/13/2008 0615   CHOLHDL 5.9 08/13/2008 0615   VLDL 32 08/13/2008 0615   LDLCALC (H) 08/13/2008 0615    105        Total Cholesterol/HDL:CHD Risk Coronary Heart Disease Risk Table                     Men   Women  1/2 Average Risk   3.4   3.3    BNP (last 3 results) Recent Labs    10/19/19 0758  BNP 552.4*    HEMOGLOBIN A1C Lab Results  Component Value Date   HGBA1C  08/13/2008    5.5 (NOTE)   The ADA recommends the following therapeutic goal for glycemic   control  related to Hgb A1C measurement:   Goal of Therapy:   < 7.0% Hgb A1C   Reference: American Diabetes Association: Clinical Practice   Recommendations 2008, Diabetes Care,  2008, 31:(Suppl 1).   MPG 111 08/13/2008    Cardiac Panel (last 3 results) No results for input(s): CKTOTAL, CKMB, RELINDX in the last 8760 hours.  Invalid input(s): TROPONINHS  Lab Results  Component Value Date   CKTOTAL 235 (H) 03/11/2009   CKMB 1.1 03/11/2009    Radiology: DG Chest Portable 1 View  Result Date: 10/19/2019 CLINICAL DATA:  Shortness of breath. Chest tightness. EXAM: PORTABLE CHEST 1 VIEW COMPARISON:  08/17/2011 FINDINGS: Chronic cardiomegaly. Pulmonary vascularity is normal. Diffuse slight accentuation of the interstitial  markings, new since the prior study. No acute bone abnormality. IMPRESSION: 1. New slight diffuse interstitial accentuation. This could represent interstitial edema or pneumonitis. 2. Stable chronic cardiomegaly since the prior study. Electronically Signed   By: Francene Boyers M.D.   On: 10/19/2019 08:48    Scheduled Meds: . enoxaparin (LOVENOX) injection  40 mg Subcutaneous Q24H  . furosemide  40 mg Intravenous BID  . sodium chloride flush  3 mL Intravenous Q12H   Continuous Infusions: . sodium chloride     PRN Meds:.sodium chloride, acetaminophen, ipratropium-albuterol, ondansetron (ZOFRAN) IV, sodium chloride flush  CARDIAC STUDIES:  EKG 10/19/2019: Sinus rhythm 59 bpm.  Baseline wander.  Nonspecific T wave abnormalities anterior leads.   Nuclear stress test 01/23/2018: EF > 70% No ischemia  Echocardiogram 01/23/2018: Mild LVH.  EF 60-65%.  Pseudonormal filling. Biatrial enlargement. No significant valvular abnormalities.  Assessment & Recommendations:  68 y.o. Caucasian female  with hypertension, hyperlipidemia, history of stroke, HFpEF, tobacco abuse, COPD, hypothyroidism, transferred from med [facility] for management of chest pain or shortness of breath.  Shortness of breath: Likely hypertensive urgency and acute on chronic diastolic heart failure.  Currently on lasix IV 40 mg bid. Recommend echocardiogram.  Hypertension: Uncontrolled.  On labetalol, Imdur, and minoxidil. Recommend losartan 50 mg, in lieu of minoxidil, given her HFpEF diagnosis. Okay to resume rest of the home meds.   Chest pain, troponin elevation:  Chest pain is pleuritic. Defer management to primary team.  Due to supply demand mismatch. Unlikely to be ACS. If chest pain persists, and trop flat, will consider stress test tomorrow.     Elder Negus, MD 10/19/2019, 2:37 PM [facility] [place] Pager: [phone] Office: [phone] If no answer Cell [phone]

## 2019-10-19 NOTE — ED Notes (Signed)
Pt c/o purewick not working. Pts linens wet, Bedding changed. RN informed that purewick may need replacing

## 2019-10-19 NOTE — ED Notes (Signed)
RT at bedside. 2L oxygen applied to patient.

## 2019-10-19 NOTE — Plan of Care (Signed)
[facility] transfer discussed with Dr. Alvino Chapel. [name] [name] is a 68 year old female with pmh HTN, HLD, COPD, diastolic CHF last EF 55 to 60% with grade 2dd in 06/2010, hypothyroidism, CVA, depression, sleep apnea, and renal insufficiency.  Presents with complaints of chest tightness and discomfort.  Found to be visibly working to breathe, but O2 saturation maintained on room air.  BNP 554 and initial troponin 30.  Chest x-ray revealed stable cardiomegaly and concern for diffuse interstitial edema versus pneumonitis.  Patient was given 40 mg of Lasix IV. [facility] called to admit.  Accepted to a telemetry bed as inpatient.

## 2019-10-19 NOTE — ED Notes (Signed)
Last used albuterol last night. Reports feeling tightness in chest, denies chest pain.

## 2019-10-19 NOTE — ED Notes (Signed)
XR at bedside

## 2019-10-19 NOTE — ED Provider Notes (Signed)
[facility] EMERGENCY DEPARTMENT Provider Note   CSN: [id number] Arrival date & time: 10/19/19  7893     History Chief Complaint  Patient presents with  . Shortness of Breath    [name] [name] is a 68 y.o. female.  HPI    Patient presents with chest pain shortness of breath.  Began yesterday.  Reported history of CHF and COPD.  States she used albuterol last night without much relief.  No fevers or chills.  No real cough.  Mild swelling her legs.  Pain in her chest is dull.  In the mid chest.  Feels as if she is having more difficulty breathing.  No abdominal pain.  Denies any Covid contacts. Past Medical History:  Diagnosis Date  . Angioedema   . Depression   . Edema   . HLD (hyperlipidemia)   . HTN (hypertension)   . Mild renal insufficiency   . Mini stroke ([place])   . Osteopenia    -1.8 03/2010  . Pulmonary nodule   . Sleep apnea    has cpap machine x non compliant due to "panic". Managed by Dr. Zacarias Pontes n post viral reactive cough periodically since 2002  . Stroke ([place]) 2015  . Thyroid disease   . Tremor    of unclear etiology  . VAIN I (vaginal intraepithelial neoplasia grade I) 2009    Patient Active Problem List   Diagnosis Date Noted  . Hypothyroidism 07/30/2018  . Generalized anxiety disorder 04/11/2013  . Major depressive disorder, recurrent episode, severe ([place]) 04/11/2013  . HYPOKALEMIA 06/07/2010  . HYPERCHOLESTEROLEMIA-PURE 11/12/2008  . TREMOR 11/12/2008  . SHORTNESS OF BREATH (SOB) 12/25/2007  . DEPRESSION 12/08/2007  . HYPERTENSION 12/08/2007  . SLEEP APNEA 12/08/2007  . PULMONARY NODULE 12/04/2007  . COUGH 12/04/2007    Past Surgical History:  Procedure Laterality Date  . ABDOMINAL HYSTERECTOMY  2000   TAH  . bilateral tube ligation    . BROW LIFT    . HERNIA REPAIR    . hysterectomy other)    . left knee replacement    . post lumpectomy    . stamey procedure    . WRIST FRACTURE SURGERY  1/12     OB History    Gravida   1   Para  1   Term      Preterm      AB      Living  1     SAB      TAB      Ectopic      Multiple      Live Births           Obstetric Comments  I adopted child        Family History  Problem Relation Age of Onset  . COPD Father        17% capacity  . Heart attack Father        in his 31s  . Diabetes Father   . Hypertension Father   . Hypertension Mother   . Diabetes Mother   . Heart attack Mother   . Alcohol abuse Maternal Grandfather   . Alcohol abuse Paternal Grandfather   . Seizures Neg Hx     Social History   Tobacco Use  . Smoking status: Never Smoker  . Smokeless tobacco: Never Used  . Tobacco comment: nonsmoker. Passive 1st 20 years of life   Substance Use Topics  . Alcohol use: Yes    Alcohol/week: 0.5  standard drinks    Types: 1 Standard drinks or equivalent per week    Comment: rare  . Drug use: No    Comment: Coffee  Caffeinated Beverages 24 ounces per day.    Home Medications Prior to Admission medications   Medication Sig Start Date End Date Taking? Authorizing Provider  albuterol (PROVENTIL HFA;VENTOLIN HFA) 108 (90 BASE) MCG/ACT inhaler Inhale 2 puffs into the lungs every 6 (six) hours as needed for wheezing.    [provider]  ALPRAZolam Prudy Feeler(XANAX) 0.5 MG tablet Take 0.5 mg by mouth at bedtime as needed.     [provider]  antipyrine-benzocaine Lyla Son(AURALGAN) otic solution Reported on 05/04/2016 05/20/13   [provider]  atorvastatin (LIPITOR) 20 MG tablet Take 20 mg by mouth daily.    [provider]  cetirizine (ZYRTEC) 10 MG tablet Take 10 mg by mouth daily.    [provider]  clopidogrel (PLAVIX) 75 MG tablet Take 75 mg by mouth daily.    [provider]  EPIPEN 2-PAK 0.3 MG/0.3ML SOAJ injection  06/25/13   [provider]  Fluticasone-Salmeterol (ADVAIR) 500-50 MCG/DOSE AEPB Inhale 1 puff into the lungs every 12 (twelve) hours.    [provider]    furosemide (LASIX) 40 MG tablet Take 40 mg by mouth.    [provider]  gabapentin (NEURONTIN) 100 MG capsule Take 300 mg by mouth 3 (three) times daily. 300mg  in the morning and noon. And 600mg  at bedtime    [provider]  isosorbide mononitrate (IMDUR) 30 MG 24 hr tablet  06/10/13   [provider]  labetalol (NORMODYNE) 200 MG tablet Take 500 mg by mouth 2 (two) times daily.     [provider]  levothyroxine (SYNTHROID, LEVOTHROID) 25 MCG tablet Take 25 mcg by mouth daily before breakfast.    [provider]  minoxidil (LONITEN) 10 MG tablet Take 10 mg by mouth daily.     [provider]  nitroGLYCERIN (NITROSTAT) 0.4 MG SL tablet Place 0.4 mg under the tongue every 5 (five) minutes as needed for chest pain.    [provider]  pantoprazole (PROTONIX) 40 MG tablet Take 40 mg by mouth daily.    [provider]  sertraline (ZOLOFT) 100 MG tablet Take 1 tablet (100 mg total) by mouth daily. 11/28/13 08/06/19  Puthuvel, Iven FinnShaji J, MD  topiramate (TOPAMAX) 100 MG tablet Take 100 mg by mouth 2 (two) times daily.    [provider]  traMADol (ULTRAM) 50 MG tablet Take 50 mg by mouth every 6 (six) hours as needed.    [provider]    Allergies    Other, Ace inhibitors, Beta adrenergic blockers, Cephalexin, Codeine, Meperidine hcl, and Levofloxacin  Review of Systems   Review of Systems  Constitutional: Positive for fatigue. Negative for appetite change and fever.  HENT: Negative for congestion.   Respiratory: Positive for shortness of breath.   Cardiovascular: Positive for chest pain.  Gastrointestinal: Negative for abdominal pain.  Genitourinary: Negative for flank pain.  Musculoskeletal: Negative for back pain.  Skin: Negative for rash.  Neurological: Negative for weakness.  Psychiatric/Behavioral: Negative for confusion.    Physical Exam Updated Vital Signs BP (!) 158/58   Pulse (!) 58   Temp  99.5 F (37.5 C) (Oral)   Resp 19   SpO2 99%   Physical Exam Vitals and nursing note reviewed.  HENT:     Head: Normocephalic.  Cardiovascular:     Rate  and Rhythm: Normal rate.  Pulmonary:     Comments: Mildly harsh breath sounds.  Some increased work of breathing.  No frank wheezes.   Chest:     Chest wall: No tenderness.  Musculoskeletal:     Cervical back: Neck supple.     Comments: Mild pitting edema bilateral lower extremities.  Skin:    Capillary Refill: Capillary refill takes less than 2 seconds.  Neurological:     Mental Status: She is alert and oriented to person, place, and time.     ED Results / Procedures / Treatments   Labs (all labs ordered are listed, but only abnormal results are displayed) Labs Reviewed  BRAIN NATRIURETIC PEPTIDE - Abnormal; Notable for the following components:      Result Value   B Natriuretic Peptide 552.4 (*)    All other components within normal limits  COMPREHENSIVE METABOLIC PANEL - Abnormal; Notable for the following components:   Calcium 8.8 (*)    AST 11 (*)    All other components within normal limits  CBC WITH DIFFERENTIAL/PLATELET - Abnormal; Notable for the following components:   Hemoglobin 11.2 (*)    HCT 34.8 (*)    All other components within normal limits  TROPONIN I (HIGH SENSITIVITY) - Abnormal; Notable for the following components:   Troponin I (High Sensitivity) 30 (*)    All other components within normal limits  SARS CORONAVIRUS 2 AG (30 MIN TAT)  SARS CORONAVIRUS 2 (TAT 6-24 HRS)    EKG EKG Interpretation  Date/Time:  Saturday October 19 2019 07:45:26 EST Ventricular Rate:  59 PR Interval:    QRS Duration: 126 QT Interval:  502 QTC Calculation: 498 R Axis:   107 Text Interpretation: Sinus rhythm with artifact Nonspecific intraventricular conduction delay Borderline T abnormalities, anterior leads Confirmed by Benjiman Core [phone]) on 10/19/2019 7:57:43 AM   Radiology DG Chest Portable 1  View  Result Date: 10/19/2019 CLINICAL DATA:  Shortness of breath. Chest tightness. EXAM: PORTABLE CHEST 1 VIEW COMPARISON:  08/17/2011 FINDINGS: Chronic cardiomegaly. Pulmonary vascularity is normal. Diffuse slight accentuation of the interstitial markings, new since the prior study. No acute bone abnormality. IMPRESSION: 1. New slight diffuse interstitial accentuation. This could represent interstitial edema or pneumonitis. 2. Stable chronic cardiomegaly since the prior study. Electronically Signed   By: Francene Boyers M.D.   On: 10/19/2019 08:48    Procedures Procedures (including critical care time)  Medications Ordered in ED Medications  furosemide (LASIX) injection 40 mg (40 mg Intravenous Given 10/19/19 0910)    ED Course  I have reviewed the triage vital signs and the nursing notes.  Pertinent labs & imaging results that were available during my care of the patient were reviewed by me and considered in my medical decision making (see chart for details).    MDM Rules/Calculators/A&P     CHA2DS2/VAS Stroke Risk Points      N/A >= 2 Points: High Risk  1 - 1.99 Points: Medium Risk  0 Points: Low Risk    A final score could not be computed because of missing components.: Last  Change: N/A     This score determines the patient's risk of having a stroke if the  patient has atrial fibrillation.      This score is not applicable to this patient. Components are not  calculated.                  Patient presents with shortness of breath and chest pain.  Some mild tightness.  Occasional cough without production.  History of COPD and CHF per patient.  Mild edema on legs.  However x-ray showed possible edema and BNP is elevated.  Troponin mildly elevated.  With the mild chest tightness she had a feels patient benefit from admission to the hospital.  Will  admit to hospitalist.    Final Clinical Impression(s) / ED Diagnoses Final diagnoses:  Congestive heart failure, unspecified HF  chronicity, unspecified heart failure type [facility])    Rx / DC Orders ED Discharge Orders    None       Benjiman Core, MD 10/19/19 [phone]

## 2019-10-19 NOTE — Plan of Care (Signed)
  Problem: Education: Goal: Ability to demonstrate management of disease process will improve Outcome: Progressing   Problem: Activity: Goal: Capacity to carry out activities will improve Outcome: Progressing   Problem: Education: Goal: Ability to verbalize understanding of medication therapies will improve Outcome: Progressing

## 2019-10-19 NOTE — Progress Notes (Signed)
ANTICOAGULATION CONSULT NOTE - Initial Consult  Pharmacy Consult:  Heparin Indication: chest pain/ACS  Allergies  Allergen Reactions  . Other Other (See Comments)    PHENYLEDIAMINE - CAUSES ANGIO EDEMA  . Beta Adrenergic Blockers   . Cephalexin Itching  . Codeine Itching  . Meperidine Hcl   . Ace Inhibitors Cough    Coughing.   . Levofloxacin Other (See Comments)    Urine burned with odor    Patient Measurements: Height: 5\' 2"  (157.5 cm) Weight: 185 lb 6.4 oz (84.1 kg) IBW/kg (Calculated) : 50.1 Heparin Dosing Weight: 69 kg  Vital Signs: Temp: 98.4 F (36.9 C) (12/12 1328) Temp Source: Oral (12/12 1328) BP: 149/97 (12/12 1328) Pulse Rate: 43 (12/12 1328)  Labs: Recent Labs    10/19/19 0758 10/19/19 0954 10/19/19 1534 10/19/19 1708  HGB 11.2*  --   --   --   HCT 34.8*  --   --   --   PLT 155  --   --   --   CREATININE 0.92  --   --   --   TROPONINIHS 30* 33* 55* 65*    Estimated Creatinine Clearance: 58.9 mL/min (by C-G formula based on SCr of 0.92 mg/dL).   Medical History: Past Medical History:  Diagnosis Date  . Angioedema   . Depression   . Edema   . HLD (hyperlipidemia)   . HTN (hypertension)   . Mild renal insufficiency   . Mini stroke ([place])   . Osteopenia    -1.8 03/2010  . Pulmonary nodule   . Sleep apnea    has cpap machine x non compliant due to "panic". Managed by Dr. Zacarias Pontes n post viral reactive cough periodically since 2002  . Stroke ([place]) 2015  . Thyroid disease   . Tremor    of unclear etiology  . VAIN I (vaginal intraepithelial neoplasia grade I) 2009    Assessment: 31 YOF presented to [facility] with chest tightness and SOB, and then transferred to [facility] for further care.  Troponin is trending up so Pharmacy has been consulted to dose IV heparin for ACS.  Baseline labs reviewed and noted that she just received Lovenox 40mg  SQ at 1635.  Goal of Therapy:  Heparin level 0.3-0.7 units/ml Monitor platelets by anticoagulation  protocol: Yes   Plan:  D/C Lovenox Heparin gtt at 850 units/hr, no bolus since patient just received prophylactic Lovenox Check 6 hr heparin level Daily heparin level and CBC  Gidget Quizhpi D. Mina Marble, PharmD, BCPS, [place] 10/19/2019, 6:28 PM

## 2019-10-19 NOTE — H&P (Signed)
History and Physical    [name] [name] UXL:[id number] DOB: 1951/03/20 DOA: 10/19/2019  Referring MD/NP/PA: Davonna Belling, MD PCP: Fransisca Connors, PA-C  Patient coming from: Transfer from [facility]  Chief Complaint: Chest tightness  I have personally briefly reviewed patient's old medical records in [place]   HPI: [name] [name] is a 68 y.o. female with medical history significant of hypertension, hyperlipidemia, COPD, diastolic congestive heart failure last EF 55 to 60% with grade 2 diastolic dysfunction in 03/3663, hypothyroidism, CVA, depression, sleep apnea, dementia, and renal insufficiency.    She presents with complaints of chest tightness starting yesterday evening while trying to sleep.  History is obtained mostly from the patient's husband as she does have history of dementia.  Normally patient reports that she is not on oxygen.  Associated symptoms to include dyspnea on exertion, some diaphoresis, intermittent leg swelling, and husband reported wheezing at night.  Denies having chest pain, fever, chills, nausea, vomiting, abdominal pain, recent sick contacts, or significant weight gain.  She has never smoked, but reports significant secondary smoke exposure while growing up.  She reports a history of congestive heart failure previously followed by cardiology at [facility], but has not seen them in about 2 years.    ED Course:   At [facility] patient was found to visibly working to breathe.  Patient was noted to be afebrile, pulse 38-66, respirations 18-28, blood pressures 149/97-180 5/65, and O2 saturations 95-100% on 2 L of nasal cannula oxygen for comfort BNP 552.4 and initial troponin 30.  Chest x-ray revealed stable cardiomegaly and concern for diffuse interstitial edema versus pneumonitis.  Patient was given 40 mg of Lasix IV.  COVID-19 screening was negative. [facility] called to admit and was accepted to a telemetry bed as inpatient.  Review of Systems  Constitutional:  Positive for diaphoresis. Negative for fever and weight loss.  HENT: Negative for ear discharge and nosebleeds.   Eyes: Negative for photophobia and pain.  Respiratory: Positive for shortness of breath and wheezing. Negative for cough.   Cardiovascular: Positive for leg swelling. Negative for chest pain.  Gastrointestinal: Negative for nausea.  Genitourinary: Negative for dysuria and hematuria.  Musculoskeletal: Negative for falls.  Skin: Negative for itching.  Neurological: Negative for focal weakness and loss of consciousness.  Psychiatric/Behavioral: Negative for substance abuse. The patient has insomnia.     Past Medical History:  Diagnosis Date  . Angioedema   . Depression   . Edema   . HLD (hyperlipidemia)   . HTN (hypertension)   . Mild renal insufficiency   . Mini stroke ([place])   . Osteopenia    -1.8 03/2010  . Pulmonary nodule   . Sleep apnea    has cpap machine x non compliant due to "panic". Managed by Dr. Zacarias Pontes n post viral reactive cough periodically since 2002  . Stroke ([place]) 2015  . Thyroid disease   . Tremor    of unclear etiology  . VAIN I (vaginal intraepithelial neoplasia grade I) 2009    Past Surgical History:  Procedure Laterality Date  . ABDOMINAL HYSTERECTOMY  2000   TAH  . bilateral tube ligation    . BROW LIFT    . HERNIA REPAIR    . hysterectomy other)    . left knee replacement    . post lumpectomy    . stamey procedure    . WRIST FRACTURE SURGERY  1/12     reports that she has never smoked.  She has never used smokeless tobacco. She reports current alcohol use of about 0.5 standard drinks of alcohol per week. She reports that she does not use drugs.  Allergies  Allergen Reactions  . Other Other (See Comments)    PHENYLEDIAMINE - CAUSES ANGIO EDEMA  . Ace Inhibitors   . Beta Adrenergic Blockers   . Cephalexin Itching  . Codeine Itching  . Meperidine Hcl   . Levofloxacin Other (See Comments)    Urine burned with odor     Family History  Problem Relation Age of Onset  . COPD Father        17% capacity  . Heart attack Father        in his 49s  . Diabetes Father   . Hypertension Father   . Hypertension Mother   . Diabetes Mother   . Heart attack Mother   . Alcohol abuse Maternal Grandfather   . Alcohol abuse Paternal Grandfather   . Seizures Neg Hx    No current facility-administered medications on file prior to encounter.   Current Outpatient Medications on File Prior to Encounter  Medication Sig Dispense Refill  . albuterol (PROVENTIL HFA;VENTOLIN HFA) 108 (90 BASE) MCG/ACT inhaler Inhale 2 puffs into the lungs every 6 (six) hours as needed for wheezing.    Marland Kitchen ALPRAZolam (XANAX) 0.5 MG tablet Take 0.5 mg by mouth at bedtime as needed for sleep.     Marland Kitchen atorvastatin (LIPITOR) 20 MG tablet Take 20 mg by mouth daily.    . cetirizine (ZYRTEC) 10 MG tablet Take 10 mg by mouth daily as needed for allergies.     Marland Kitchen clopidogrel (PLAVIX) 75 MG tablet Take 75 mg by mouth daily.    . cyanocobalamin (,VITAMIN B-12,) 1000 MCG/ML injection Inject 1 mL into the muscle every 30 (thirty) days.    Marland Kitchen donepezil (ARICEPT) 10 MG tablet Take 10 mg by mouth daily.    . fluticasone furoate-vilanterol (BREO ELLIPTA) 100-25 MCG/INH AEPB Inhale 1 puff into the lungs daily.    . Fluticasone-Salmeterol (ADVAIR) 500-50 MCG/DOSE AEPB Inhale 1 puff into the lungs every 12 (twelve) hours.    . furosemide (LASIX) 40 MG tablet Take 40 mg by mouth daily.     Marland Kitchen gabapentin (NEURONTIN) 300 MG capsule Take 300-600 mg by mouth 2 (two) times daily. Take 337m in the morning and noon; then take 6073mat bedtime    . isosorbide mononitrate (IMDUR) 30 MG 24 hr tablet Take 60 mg by mouth daily.     . Marland Kitchenabetalol (NORMODYNE) 100 MG tablet Take 250 mg by mouth 2 (two) times daily. Dose reduced per husband    . levothyroxine (SYNTHROID) 50 MCG tablet Take 50 mcg by mouth daily before breakfast.    . minoxidil (LONITEN) 10 MG tablet Take 10 mg by  mouth daily.     . nitroGLYCERIN (NITROSTAT) 0.4 MG SL tablet Place 0.4 mg under the tongue every 5 (five) minutes as needed for chest pain.    . Marland Kitchenndansetron (ZOFRAN-ODT) 4 MG disintegrating tablet Take 4 mg by mouth every 8 (eight) hours as needed for nausea.    . pantoprazole (PROTONIX) 40 MG tablet Take 40 mg by mouth daily.    . primidone (MYSOLINE) 50 MG tablet Take 50 mg by mouth daily.    . Marland KitchenROCTOZONE-HC 2.5 % rectal cream Place 1 application rectally 2 (two) times daily as needed for hemorrhoids.    . topiramate (TOPAMAX) 50 MG tablet Take 50 mg by mouth 2 (two)  times daily.    . traMADol (ULTRAM) 50 MG tablet Take 50 mg by mouth every 6 (six) hours as needed for moderate pain.     . Vitamin D, Ergocalciferol, (DRISDOL) 1.25 MG (50000 UT) CAPS capsule Take 50,000 Units by mouth once a week.    Marland Kitchen antipyrine-benzocaine (AURALGAN) otic solution Place 2 drops into both ears as needed (ear infection). Reported on 05/04/2016    . EPIPEN 2-PAK 0.3 MG/0.3ML SOAJ injection Inject 0.3 mg into the muscle as needed for anaphylaxis.     . rivastigmine (EXELON) 4.5 MG capsule Take 4.5 mg by mouth 2 (two) times daily.    . sertraline (ZOLOFT) 100 MG tablet Take 1 tablet (100 mg total) by mouth daily. (Patient taking differently: Take 150 mg by mouth daily. ) 30 tablet 1    Physical Exam:  Constitutional: Obese elderly female who appears to be no acute distress at this time Vitals:   10/19/19 1030 10/19/19 1100 10/19/19 1149 10/19/19 1153  BP: (!) 180/76 (!) 166/64 (!) 154/59 (!) 154/59  Pulse: (!) 38 (!) 50 60 60  Resp: (!) 26 18 (!) 28 (!) 28  Temp:   98.7 F (37.1 C) 98.7 F (37.1 C)  TempSrc:   Oral   SpO2: 100% 96% 100% 98%   Eyes: PERRL, lids and conjunctivae normal ENMT: Mucous membranes are moist. Posterior pharynx clear of any exudate or lesions.  Neck: normal, supple, no masses, no thyromegaly.  Mild JVD noted. Respiratory: Positive crackles heard in the mid to lower lung fields with  no significant wheezes appreciated.  Patient currently on 2 L of nasal cannula oxygen. Cardiovascular: Bradycardic, no murmurs / rubs / gallops.  Trace to 1+ pitting bilateral lower extremity edema. 2+ pedal pulses. No carotid bruits.  Abdomen: no tenderness, no masses palpated. No hepatosplenomegaly. Bowel sounds positive.  Musculoskeletal: no clubbing / cyanosis. No joint deformity upper and lower extremities. Good ROM, no contractures. Normal muscle tone.  Skin: no rashes, lesions, ulcers. No induration Neurologic: CN 2-12 grossly intact. Sensation intact, DTR normal. Strength 5/5 in all 4.  Psychiatric: Normal judgment and insight. Alert and oriented x 3. Normal mood.     Labs on Admission: I have personally reviewed following labs and imaging studies  CBC: Recent Labs  Lab 10/19/19 0758  WBC 7.5  NEUTROABS 5.8  HGB 11.2*  HCT 34.8*  MCV 85.7  PLT 224   Basic Metabolic Panel: Recent Labs  Lab 10/19/19 0758  NA 137  K 3.6  CL 107  CO2 23  GLUCOSE 90  BUN 11  CREATININE 0.92  CALCIUM 8.8*   GFR: CrCl cannot be calculated (Unknown ideal weight.). Liver Function Tests: Recent Labs  Lab 10/19/19 0758  AST 11*  ALT 8  ALKPHOS 91  BILITOT 1.2  PROT 7.1  ALBUMIN 4.3   No results for input(s): LIPASE, AMYLASE in the last 168 hours. No results for input(s): AMMONIA in the last 168 hours. Coagulation Profile: No results for input(s): INR, PROTIME in the last 168 hours. Cardiac Enzymes: No results for input(s): CKTOTAL, CKMB, CKMBINDEX, TROPONINI in the last 168 hours. BNP (last 3 results) No results for input(s): PROBNP in the last 8760 hours. HbA1C: No results for input(s): HGBA1C in the last 72 hours. CBG: No results for input(s): GLUCAP in the last 168 hours. Lipid Profile: No results for input(s): CHOL, HDL, LDLCALC, TRIG, CHOLHDL, LDLDIRECT in the last 72 hours. Thyroid Function Tests: No results for input(s): TSH, T4TOTAL, FREET4, T3FREE,  THYROIDAB in  the last 72 hours. Anemia Panel: No results for input(s): VITAMINB12, FOLATE, FERRITIN, TIBC, IRON, RETICCTPCT in the last 72 hours. Urine analysis:    Component Value Date/Time   COLORURINE YELLOW 05/04/2016 [place] 05/04/2016 1215   LABSPEC 1.013 05/04/2016 1215   PHURINE 5.5 05/04/2016 1215   GLUCOSEU NEGATIVE 05/04/2016 1215   HGBUR NEGATIVE 05/04/2016 1215   BILIRUBINUR NEGATIVE 05/04/2016 1215   KETONESUR NEGATIVE 05/04/2016 1215   PROTEINUR NEGATIVE 05/04/2016 1215   UROBILINOGEN 1.0 11/20/2008 1644   NITRITE NEGATIVE 05/04/2016 1215   LEUKOCYTESUR NEGATIVE 05/04/2016 1215   Sepsis Labs: Recent Results (from the past 240 hour(s))  SARS Coronavirus 2 Ag (30 min TAT) - Nasal Swab (BD Veritor Kit)     Status: None   Collection Time: 10/19/19  7:58 AM   Specimen: Nasal Swab (BD Veritor Kit)  Result Value Ref Range Status   SARS Coronavirus 2 Ag NEGATIVE NEGATIVE Final    Comment: (NOTE) SARS-CoV-2 antigen NOT DETECTED.  Negative results are presumptive.  Negative results do not preclude SARS-CoV-2 infection and should not be used as the sole basis for treatment or other patient management decisions, including infection  control decisions, particularly in the presence of clinical signs and  symptoms consistent with COVID-19, or in those who have been in contact with the virus.  Negative results must be combined with clinical observations, patient history, and epidemiological information. The expected result is Negative. Fact Sheet for Patients: PodPark.tn Fact Sheet for Healthcare Providers: GiftContent.is This test is not yet approved or cleared by the [place] FDA and  has been authorized for detection and/or diagnosis of SARS-CoV-2 by FDA under an Emergency Use Authorization (EUA).  This EUA will remain in effect (meaning this test can be used) for the duration of  the COVID-19 de  claration under Section 564(b)(1) of the Act, 21 U.S.C. section 360bbb-3(b)(1), unless the authorization is terminated or revoked sooner. Performed at [facility], [address] [place], [place] [address]      Radiological Exams on Admission: DG Chest Portable 1 View  Result Date: 10/19/2019 CLINICAL DATA:  Shortness of breath. Chest tightness. EXAM: PORTABLE CHEST 1 VIEW COMPARISON:  08/17/2011 FINDINGS: Chronic cardiomegaly. Pulmonary vascularity is normal. Diffuse slight accentuation of the interstitial markings, new since the prior study. No acute bone abnormality. IMPRESSION: 1. New slight diffuse interstitial accentuation. This could represent interstitial edema or pneumonitis. 2. Stable chronic cardiomegaly since the prior study. Electronically Signed   By: Lorriane Shire M.D.   On: 10/19/2019 08:48    EKG: Independently reviewed.  bradycardia at 59 bpm with QTc 498  Assessment/Plan Diastolic congestive heart failure exacerbation: Acute on chronic.  Patient presents with acute onset of chest tightness.  Chest x-ray showing signs of interstitial edema and cardiomegaly.  BNP elevated at 552.4.  Suspect acute exacerbation of CHF. -Admit to a cardiac telemetry bed - Heart failure orders set  initiated  - Continuous pulse oximetry with nasal cannula oxygen as needed to keep O2 saturations >92% - Strict I&Os and daily weights - Elevate lower extremities - Lasix 40 mg IV Bid - Reassess in a.m. and adjust diuresis as needed. - Check echocardiogram - Optimize medical management  - Cardiology consulted, will follow for further recommendations  Bradycardia: Heart rates noted to be 38-66.  Blood pressures currently stable and appears to be asymptomatic. - Follow-up telemetry overnight   COPD, without acute exacerbation - Duonebs as needed for  shortness of breath/wheezing  Elevated troponin: Acute.  She complains of chest tightness.  High-sensitivity troponin 30-> 33.   Suspect secondary to demand in the setting of CHF. - Continue to trend cardiac troponins - May warrant stress testing in a.m., if troponins continue to rise  Prolonged QT interval: Acute.  QTc 498 on admission. -Correct any electrolyte abnormality - Recheck EKG in a.m.  Normocytic anemia: Hemoglobin 11.2 which appears slightly lower than baseline about 12 when comparing to records from care everywhere. - Continue to monitor  Essential hypertension: Patient's blood pressures initially elevated up to 185/65.  Elevation may do to missed doses of her home blood pressure medication regimen. -Continue isosorbide mononitrate, labetalol, and minoxidil  Hypothyroidism -Check TSH -Continue levothyroxine  Dementia -Continue Aricept  Hyperlipidemia -Continue atorvastatin  Anxiety/depression -Xanax as needed   Gerd  -Hold protonix  DVT prophylaxis: lovenox Code Status: Full Family Communication: Husband updated over the phone Disposition Plan: Possible discharge home in 2 to 3 days Consults called: Cardiology Admission status: Inpatient   Norval Morton MD [facility] Pager [phone]   If 7PM-7AM, please contact night-coverage [email or web address] Password [email or web address]  10/19/2019, 1:29 PM

## 2019-10-19 NOTE — ED Notes (Signed)
Report given to [facility], RN with [facility]

## 2019-10-19 NOTE — ED Notes (Signed)
Report called to [facility] at [facility]

## 2019-10-19 NOTE — ED Notes (Signed)
ED Provider at bedside. 

## 2019-10-19 NOTE — Progress Notes (Signed)
Trop HS mildly elevated, but continues to climb with significant delta. Will start on heparin and nitroglycerin drip with possibility of cath Monday 12/14 morning.

## 2019-10-20 ENCOUNTER — Inpatient Hospital Stay (HOSPITAL_COMMUNITY): Payer: Medicare Other

## 2019-10-20 DIAGNOSIS — R9431 Abnormal electrocardiogram [ECG] [EKG]: Secondary | ICD-10-CM

## 2019-10-20 DIAGNOSIS — E876 Hypokalemia: Secondary | ICD-10-CM

## 2019-10-20 DIAGNOSIS — E038 Other specified hypothyroidism: Secondary | ICD-10-CM

## 2019-10-20 DIAGNOSIS — I5031 Acute diastolic (congestive) heart failure: Secondary | ICD-10-CM

## 2019-10-20 DIAGNOSIS — E7800 Pure hypercholesterolemia, unspecified: Secondary | ICD-10-CM

## 2019-10-20 DIAGNOSIS — I10 Essential (primary) hypertension: Secondary | ICD-10-CM

## 2019-10-20 DIAGNOSIS — R001 Bradycardia, unspecified: Secondary | ICD-10-CM

## 2019-10-20 DIAGNOSIS — F329 Major depressive disorder, single episode, unspecified: Secondary | ICD-10-CM

## 2019-10-20 DIAGNOSIS — F419 Anxiety disorder, unspecified: Secondary | ICD-10-CM

## 2019-10-20 DIAGNOSIS — I5033 Acute on chronic diastolic (congestive) heart failure: Secondary | ICD-10-CM

## 2019-10-20 LAB — CBC WITH DIFFERENTIAL/PLATELET
Abs Immature Granulocytes: 0.03 10*3/uL (ref 0.00–0.07)
Basophils Absolute: 0 10*3/uL (ref 0.0–0.1)
Basophils Relative: 0 %
Eosinophils Absolute: 0.1 10*3/uL (ref 0.0–0.5)
Eosinophils Relative: 1 %
HCT: 35.1 % — ABNORMAL LOW (ref 36.0–46.0)
Hemoglobin: 11.7 g/dL — ABNORMAL LOW (ref 12.0–15.0)
Immature Granulocytes: 0 %
Lymphocytes Relative: 16 %
Lymphs Abs: 1.1 10*3/uL (ref 0.7–4.0)
MCH: 27.7 pg (ref 26.0–34.0)
MCHC: 33.3 g/dL (ref 30.0–36.0)
MCV: 83.2 fL (ref 80.0–100.0)
Monocytes Absolute: 0.7 10*3/uL (ref 0.1–1.0)
Monocytes Relative: 10 %
Neutro Abs: 5.3 10*3/uL (ref 1.7–7.7)
Neutrophils Relative %: 73 %
Platelets: 163 10*3/uL (ref 150–400)
RBC: 4.22 MIL/uL (ref 3.87–5.11)
RDW: 13.3 % (ref 11.5–15.5)
WBC: 7.2 10*3/uL (ref 4.0–10.5)
nRBC: 0 % (ref 0.0–0.2)

## 2019-10-20 LAB — BASIC METABOLIC PANEL
Anion gap: 13 (ref 5–15)
BUN: 11 mg/dL (ref 8–23)
CO2: 24 mmol/L (ref 22–32)
Calcium: 8.8 mg/dL — ABNORMAL LOW (ref 8.9–10.3)
Chloride: 102 mmol/L (ref 98–111)
Creatinine, Ser: 0.97 mg/dL (ref 0.44–1.00)
GFR calc Af Amer: >60 mL/min (ref >60)
GFR calc non Af Amer: >60 mL/min (ref >60)
Glucose, Bld: 111 mg/dL — ABNORMAL HIGH (ref 70–99)
Potassium: 3.1 mmol/L — ABNORMAL LOW (ref 3.5–5.1)
Sodium: 139 mmol/L (ref 135–145)

## 2019-10-20 LAB — ECHOCARDIOGRAM COMPLETE
Height: 62 in
Weight: 2955.2 oz

## 2019-10-20 LAB — HEPARIN LEVEL (UNFRACTIONATED)
Heparin Unfractionated: 0.38 IU/mL (ref 0.30–0.70)
Heparin Unfractionated: 0.12 IU/mL — ABNORMAL LOW (ref 0.30–0.70)
Heparin Unfractionated: 0.22 IU/mL — ABNORMAL LOW (ref 0.30–0.70)

## 2019-10-20 LAB — TROPONIN I (HIGH SENSITIVITY): Troponin I (High Sensitivity): 33 ng/L — ABNORMAL HIGH (ref <18)

## 2019-10-20 MED ORDER — POTASSIUM CHLORIDE CRYS ER 20 MEQ PO TBCR
40.0000 meq | EXTENDED_RELEASE_TABLET | Freq: Three times a day (TID) | ORAL | Status: DC
  Administered 2019-10-20 – 2019-10-21 (×4): 40 meq via ORAL
  Filled 2019-10-20 (×4): qty 2

## 2019-10-20 MED ORDER — REGADENOSON 0.4 MG/5ML IV SOLN
INTRAVENOUS | Status: AC
  Filled 2019-10-20: qty 5

## 2019-10-20 MED ORDER — REGADENOSON 0.4 MG/5ML IV SOLN
0.4000 mg | Freq: Once | INTRAVENOUS | Status: AC
  Administered 2019-10-20: 0.4 mg via INTRAVENOUS
  Filled 2019-10-20: qty 5

## 2019-10-20 MED ORDER — TECHNETIUM TC 99M TETROFOSMIN IV KIT
30.0000 | PACK | Freq: Once | INTRAVENOUS | Status: AC | PRN
  Administered 2019-10-20: 30 via INTRAVENOUS

## 2019-10-20 MED ORDER — HEPARIN BOLUS VIA INFUSION
2000.0000 [IU] | Freq: Once | INTRAVENOUS | Status: AC
  Administered 2019-10-20: 2000 [IU] via INTRAVENOUS
  Filled 2019-10-20: qty 2000

## 2019-10-20 MED ORDER — TECHNETIUM TC 99M TETROFOSMIN IV KIT
10.0000 | PACK | Freq: Once | INTRAVENOUS | Status: AC | PRN
  Administered 2019-10-20: 10 via INTRAVENOUS

## 2019-10-20 MED ORDER — ONDANSETRON HCL 4 MG/2ML IJ SOLN
4.0000 mg | Freq: Four times a day (QID) | INTRAMUSCULAR | Status: DC | PRN
  Administered 2019-10-20 – 2019-10-22 (×2): 4 mg via INTRAVENOUS
  Filled 2019-10-20 (×2): qty 2

## 2019-10-20 NOTE — Progress Notes (Signed)
  Echocardiogram 2D Echocardiogram has been performed.  [name] [name] 10/20/2019, 5:20 PM

## 2019-10-20 NOTE — Progress Notes (Signed)
[facility] PROGRESS NOTE  [name] [name] BTD:[id number] DOB: 02-05-1951 DOA: 10/19/2019 PCP: Fransisca Connors, PA-C  Brief Summary: HPI: [name] [name] is a 68 y.o. female with medical history significant of hypertension, hyperlipidemia, COPD, diastolic congestive heart failure last EF 62 to 60% with grade 2 diastolic dysfunction in 11/624, hypothyroidism, CVA, depression, sleep apnea, dementia, and renal insufficiency.   She presents with complaints of chest tightness starting yesterday evening while trying to sleep.  History is obtained mostly from the patient's husband as she does have history of dementia.  Normally patient reports that she is not on oxygen.  Associated symptoms to include dyspnea on exertion, some diaphoresis, intermittent leg swelling, and husband reported wheezing at night.  Denies having chest pain, fever, chills, nausea, vomiting, abdominal pain, recent sick contacts, or significant weight gain.  She has never smoked, but reports significant secondary smoke exposure while growing up.  She reports a history of congestive heart failure previously followed by cardiology at [facility], but has not seen them in about 2 years.    ED Course:  At [facility] patient was found to visibly working to breathe.  Patient was noted to be afebrile, pulse 38-66, respirations 18-28, blood pressures 149/97-180 5/65, and O2 saturations 95-100% on 2 L of nasal cannula oxygen for comfort BNP 552.4 and initial troponin 30. Chest x-ray revealed stable cardiomegaly and concern for diffuse interstitial edema versus pneumonitis. Patient was given 40 mg of Lasix IV.  COVID-19 screening was negative.TRH called to admit and was accepted to a telemetry bed as inpatient.  Assessment/Plan: Principal Problem:   Acute exacerbation of CHF (congestive heart failure) (HCC) Active Problems:   HYPERCHOLESTEROLEMIA-PURE   HYPOKALEMIA   Anxiety and depression   Essential hypertension  Hypothyroidism   Prolonged QT interval   Bradycardia  Acute exacerbation of CHF  1.5 L diuresis yesterday on 40 mg of IV Lasix twice daily.  Has improved shortness of breath  Hypokalemic  replace potassium  Chest pain  Improved chest pain.  Remains on nitro and heparin for chest pain and increasing troponins.  Troponin trend: 30>33>55>65>33  Patient scheduled for stress test this morning  Hypertension  BP controlled on labetalol, losartan  Hypothyroidism  On Synthroid   Code Status: Full code Family Communication: None  Disposition Plan: Patient should be able to return home   Consultants:  Cardiology  Procedures:  Nuclear stress test on 12/13  Antibiotics:  None  HPI/Subjective: Patient without chest pain this morning.  Breathing improved as well.  Initial BP this morning was 88/77, but 120s over 60s on repeat.  Patient without any other complaint.  No abdominal pain, nausea, vomiting.  Objective: Vitals:   10/20/19 0730 10/20/19 0749  BP: (!) 108/53   Pulse: 68   Resp:    Temp:    SpO2: 93% 95%    Intake/Output Summary (Last 24 hours) at 10/20/2019 0855 Last data filed at 10/20/2019 0300 Gross per 24 hour  Intake 972.43 ml  Output 2550 ml  Net -1577.57 ml   Filed Weights   10/19/19 1328 10/20/19 0551  Weight: 84.1 kg 83.8 kg    Exam:   General: Alert and oriented x3.  No acute distress.  Cardiovascular: Regular rate, and normal S1-S2 sounds.  No murmurs.  Respiratory: Slight Rales in bases.  Normal effort.  Abdomen: Soft, nontender, nondistended.  Musculoskeletal: No contractures, focal neurological deficit.  Extremity: Trace edema.  Warm extremities with 2+ dorsalis pedis and radial pulses.  Data Reviewed: Basic Metabolic Panel: Recent Labs  Lab 10/19/19 0758 10/20/19 0152  NA 137 139  K 3.6 3.1*  CL 107 102  CO2 23 24  GLUCOSE 90 111*  BUN 11 11  CREATININE 0.92 0.97  CALCIUM 8.8* 8.8*   Liver Function  Tests: Recent Labs  Lab 10/19/19 0758  AST 11*  ALT 8  ALKPHOS 91  BILITOT 1.2  PROT 7.1  ALBUMIN 4.3   No results for input(s): LIPASE, AMYLASE in the last 168 hours. No results for input(s): AMMONIA in the last 168 hours. CBC: Recent Labs  Lab 10/19/19 0758 10/20/19 0152  WBC 7.5 7.2  NEUTROABS 5.8 5.3  HGB 11.2* 11.7*  HCT 34.8* 35.1*  MCV 85.7 83.2  PLT 155 163   Cardiac Enzymes: No results for input(s): CKTOTAL, CKMB, CKMBINDEX, TROPONINI in the last 168 hours. BNP (last 3 results) Recent Labs    10/19/19 0758  BNP 552.4*    ProBNP (last 3 results) No results for input(s): PROBNP in the last 8760 hours.  CBG: No results for input(s): GLUCAP in the last 168 hours.  Recent Results (from the past 240 hour(s))  SARS Coronavirus 2 Ag (30 min TAT) - Nasal Swab (BD Veritor Kit)     Status: None   Collection Time: 10/19/19  7:58 AM   Specimen: Nasal Swab (BD Veritor Kit)  Result Value Ref Range Status   SARS Coronavirus 2 Ag NEGATIVE NEGATIVE Final    Comment: (NOTE) SARS-CoV-2 antigen NOT DETECTED.  Negative results are presumptive.  Negative results do not preclude SARS-CoV-2 infection and should not be used as the sole basis for treatment or other patient management decisions, including infection  control decisions, particularly in the presence of clinical signs and  symptoms consistent with COVID-19, or in those who have been in contact with the virus.  Negative results must be combined with clinical observations, patient history, and epidemiological information. The expected result is Negative. Fact Sheet for Patients: PodPark.tn Fact Sheet for Healthcare Providers: GiftContent.is This test is not yet approved or cleared by the [place] FDA and  has been authorized for detection and/or diagnosis of SARS-CoV-2 by FDA under an Emergency Use Authorization (EUA).  This EUA will remain in effect  (meaning this test can be used) for the duration of  the COVID-19 de claration under Section 564(b)(1) of the Act, 21 U.S.C. section 360bbb-3(b)(1), unless the authorization is terminated or revoked sooner. Performed at [facility], [address] [place], [place] [address]   SARS CORONAVIRUS 2 (TAT 6-24 HRS) Nasopharyngeal Nasopharyngeal Swab     Status: None   Collection Time: 10/19/19  9:54 AM   Specimen: Nasopharyngeal Swab  Result Value Ref Range Status   SARS Coronavirus 2 NEGATIVE NEGATIVE Final    Comment: (NOTE) SARS-CoV-2 target nucleic acids are NOT DETECTED. The SARS-CoV-2 RNA is generally detectable in upper and lower respiratory specimens during the acute phase of infection. Negative results do not preclude SARS-CoV-2 infection, do not rule out co-infections with other pathogens, and should not be used as the sole basis for treatment or other patient management decisions. Negative results must be combined with clinical observations, patient history, and epidemiological information. The expected result is Negative. Fact Sheet for Patients: SugarRoll.be Fact Sheet for Healthcare Providers: https://www.woods-mathews.com/ This test is not yet approved or cleared by the [place] FDA and  has been authorized for detection and/or diagnosis of SARS-CoV-2 by FDA under an Emergency Use Authorization (EUA).  This EUA will remain  in effect (meaning this test can be used) for the duration of the COVID-19 declaration under Section 56 4(b)(1) of the Act, 21 U.S.C. section 360bbb-3(b)(1), unless the authorization is terminated or revoked sooner. Performed at [facility], [place] [address], [place], [place] [address]      Studies: DG Chest Portable 1 View  Result Date: 10/19/2019 CLINICAL DATA:  Shortness of breath. Chest tightness. EXAM: PORTABLE CHEST 1 VIEW COMPARISON:  08/17/2011 FINDINGS: Chronic cardiomegaly.  Pulmonary vascularity is normal. Diffuse slight accentuation of the interstitial markings, new since the prior study. No acute bone abnormality. IMPRESSION: 1. New slight diffuse interstitial accentuation. This could represent interstitial edema or pneumonitis. 2. Stable chronic cardiomegaly since the prior study. Electronically Signed   By: Lorriane Shire M.D.   On: 10/19/2019 08:48    Scheduled Meds: . atorvastatin  20 mg Oral Daily  . clopidogrel  75 mg Oral Daily  . donepezil  10 mg Oral QHS  . fluticasone furoate-vilanterol  1 puff Inhalation Daily  . furosemide  40 mg Intravenous BID  . gabapentin  300 mg Oral BID  . gabapentin  600 mg Oral QHS  . labetalol  250 mg Oral BID  . levothyroxine  50 mcg Oral QAC breakfast  . losartan  50 mg Oral Daily  . minoxidil  10 mg Oral Daily  . primidone  50 mg Oral QHS  . sertraline  100 mg Oral Daily  . sodium chloride flush  3 mL Intravenous Q12H  . topiramate  50 mg Oral BID   Continuous Infusions: . sodium chloride    . heparin 850 Units/hr (10/19/19 1842)      Time spent: [place]   10/20/2019, 8:55 AM  LOS: 1 day

## 2019-10-20 NOTE — Progress Notes (Signed)
Patient stated she does not want to wear CPAP tonight. RT instructed patient to have RT called if she changes her mind. RT will monitor as needed.

## 2019-10-20 NOTE — Progress Notes (Signed)
ANTICOAGULATION CONSULT NOTE - Initial Consult  Pharmacy Consult:  Heparin Indication: chest pain/ACS  Allergies  Allergen Reactions  . Other Other (See Comments)    PHENYLEDIAMINE - CAUSES ANGIO EDEMA  . Beta Adrenergic Blockers   . Cephalexin Itching  . Codeine Itching  . Meperidine Hcl   . Ace Inhibitors Cough    Coughing.   . Levofloxacin Other (See Comments)    Urine burned with odor    Patient Measurements: Height: 5\' 2"  (157.5 cm) Weight: 184 lb 11.2 oz (83.8 kg) IBW/kg (Calculated) : 50.1 Heparin Dosing Weight: 69 kg  Vital Signs: Temp: 98.2 F (36.8 C) (12/13 0551) Temp Source: Oral (12/13 0551) BP: 147/99 (12/13 1339) Pulse Rate: 63 (12/13 1339)  Labs: Recent Labs    10/19/19 0758 10/19/19 1534 10/19/19 1708 10/20/19 0152 10/20/19 0612 10/20/19 1335  HGB 11.2*  --   --  11.7*  --   --   HCT 34.8*  --   --  35.1*  --   --   PLT 155  --   --  163  --   --   HEPARINUNFRC  --   --   --  0.38  --  0.12*  CREATININE 0.92  --   --  0.97  --   --   TROPONINIHS 30* 55* 65*  --  33*  --     Estimated Creatinine Clearance: 55.7 mL/min (by C-G formula based on SCr of 0.97 mg/dL).   Medical History: Past Medical History:  Diagnosis Date  . Angioedema   . Depression   . Edema   . HLD (hyperlipidemia)   . HTN (hypertension)   . Mild renal insufficiency   . Mini stroke ([place])   . Osteopenia    -1.8 03/2010  . Pulmonary nodule   . Sleep apnea    has cpap machine x non compliant due to "panic". Managed by Dr. Zacarias Pontes n post viral reactive cough periodically since 2002  . Stroke ([place]) 2015  . Thyroid disease   . Tremor    of unclear etiology  . VAIN I (vaginal intraepithelial neoplasia grade I) 2009    Assessment: 80 YOF presented to [facility] with chest tightness and SOB, and then transferred to [facility] for further care.  Troponin is trending up so Pharmacy has been consulted to dose IV heparin for ACS.    Heparin level this morning around 0200 was  therapeutic at 0.38, a confirmatory level was supposed to be checked at 10 am, but the patient was in the nuclear lab at that time and a level was unable to be drawn. Per nursing, the heparin infusion was running the entire time she was gone with no infusion issues, so a stat level was ordered when she returned to her room around 1300.   This heparin level is subtherapeutic at 0.12 on 850 units/hour. Her H&H has been stable, today at 11.7/35.1, plts wnl.   Goal of Therapy:  Heparin level 0.3-0.7 units/ml Monitor platelets by anticoagulation protocol: Yes   Plan:  Heparin bolus of 2000 units x 1 Increase heparin infusion to 1050 units/hour (10.5 ml) Check 6 hr heparin level Daily heparin level and CBC   Thank you,   Eddie Candle, PharmD PGY-1 Pharmacy Resident   Please check amion for clinical pharmacist contact number

## 2019-10-20 NOTE — Progress Notes (Signed)
Pt arrived to stress lab with Sa02 86%. Pt with [place] in place. O2 tank on bed empty. Pt placed on wall )2 at 2L with Sa02 97%. Pt in no distress.

## 2019-10-20 NOTE — Progress Notes (Signed)
RN called RT to place patient on CPAP. RT set up CPAP and placed on patient with 2L O2 bled into circuit. Patient tolerating CPAP well at this time. RT will continue to monitor as needed.

## 2019-10-20 NOTE — Progress Notes (Signed)
Trop down to 33. Stress test today.

## 2019-10-20 NOTE — Progress Notes (Signed)
ANTICOAGULATION CONSULT NOTE  Pharmacy Consult:  Heparin Indication: chest pain/ACS  Allergies  Allergen Reactions  . Other Other (See Comments)    PHENYLEDIAMINE - CAUSES ANGIO EDEMA  . Beta Adrenergic Blockers   . Cephalexin Itching  . Codeine Itching  . Meperidine Hcl   . Ace Inhibitors Cough    Coughing.   . Levofloxacin Other (See Comments)    Urine burned with odor    Patient Measurements: Height: 5\' 2"  (157.5 cm) Weight: 184 lb 11.2 oz (83.8 kg) IBW/kg (Calculated) : 50.1 Heparin Dosing Weight: 69 kg  Vital Signs: Temp: 98.2 F (36.8 C) (12/13 2102) Temp Source: Oral (12/13 2102) BP: 124/50 (12/13 2102) Pulse Rate: 69 (12/13 2102)  Labs: Recent Labs    10/19/19 0758 10/19/19 1534 10/19/19 1708 10/20/19 0152 10/20/19 0612 10/20/19 1335 10/20/19 2048  HGB 11.2*  --   --  11.7*  --   --   --   HCT 34.8*  --   --  35.1*  --   --   --   PLT 155  --   --  163  --   --   --   HEPARINUNFRC  --   --   --  0.38  --  0.12* 0.22*  CREATININE 0.92  --   --  0.97  --   --   --   TROPONINIHS 30* 55* 65*  --  33*  --   --     Estimated Creatinine Clearance: 55.7 mL/min (by C-G formula based on SCr of 0.97 mg/dL).   Assessment: 65 YOF presented to [facility] with chest tightness and SOB, and then transferred to [facility] for further care.  Troponin is trending up so Pharmacy has been consulted to dose IV heparin for ACS.    Heparin level is sub-therapeutic and trending up.  No issue with infusion per RN.  Goal of Therapy:  Heparin level 0.3-0.7 units/ml Monitor platelets by anticoagulation protocol: Yes   Plan:  Increase heparin gtt to 1200 units/hr F/U AM labs  Brayon Bielefeld D. Mina Marble, PharmD, BCPS, [place] 10/20/2019, 9:44 PM

## 2019-10-20 NOTE — Progress Notes (Signed)
ANTICOAGULATION CONSULT NOTE - Follow Up Consult  Pharmacy Consult for heparin Indication: chest pain/ACS  Labs: Recent Labs    10/19/19 0758 10/19/19 0954 10/19/19 1534 10/19/19 1708 10/20/19 0152  HGB 11.2*  --   --   --  11.7*  HCT 34.8*  --   --   --  35.1*  PLT 155  --   --   --  163  HEPARINUNFRC  --   --   --   --  0.38  CREATININE 0.92  --   --   --  0.97  TROPONINIHS 30* 33* 55* 65*  --     Assessment/Plan: 68yo female therapeutic on heparin with initial dosing for CP. Will continue gtt at current rate and confirm stable with additional level.   Wynona Neat, PharmD, BCPS  10/20/2019,3:34 AM

## 2019-10-21 DIAGNOSIS — R072 Precordial pain: Secondary | ICD-10-CM

## 2019-10-21 LAB — CBC
HCT: 35.7 % — ABNORMAL LOW (ref 36.0–46.0)
Hemoglobin: 11.8 g/dL — ABNORMAL LOW (ref 12.0–15.0)
MCH: 27.7 pg (ref 26.0–34.0)
MCHC: 33.1 g/dL (ref 30.0–36.0)
MCV: 83.8 fL (ref 80.0–100.0)
Platelets: 178 10*3/uL (ref 150–400)
RBC: 4.26 MIL/uL (ref 3.87–5.11)
RDW: 13.4 % (ref 11.5–15.5)
WBC: 6.7 10*3/uL (ref 4.0–10.5)
nRBC: 0 % (ref 0.0–0.2)

## 2019-10-21 LAB — BASIC METABOLIC PANEL
Anion gap: 11 (ref 5–15)
BUN: 14 mg/dL (ref 8–23)
CO2: 23 mmol/L (ref 22–32)
Calcium: 8.9 mg/dL (ref 8.9–10.3)
Chloride: 105 mmol/L (ref 98–111)
Creatinine, Ser: 1.11 mg/dL — ABNORMAL HIGH (ref 0.44–1.00)
GFR calc Af Amer: 59 mL/min — ABNORMAL LOW (ref >60)
GFR calc non Af Amer: 51 mL/min — ABNORMAL LOW (ref >60)
Glucose, Bld: 101 mg/dL — ABNORMAL HIGH (ref 70–99)
Potassium: 3.9 mmol/L (ref 3.5–5.1)
Sodium: 139 mmol/L (ref 135–145)

## 2019-10-21 LAB — HEPARIN LEVEL (UNFRACTIONATED)
Heparin Unfractionated: 0.27 IU/mL — ABNORMAL LOW (ref 0.30–0.70)
Heparin Unfractionated: 0.33 IU/mL (ref 0.30–0.70)

## 2019-10-21 MED ORDER — TORSEMIDE 20 MG PO TABS
20.0000 mg | ORAL_TABLET | Freq: Two times a day (BID) | ORAL | Status: DC
  Administered 2019-10-21 – 2019-10-22 (×2): 20 mg via ORAL
  Filled 2019-10-21 (×2): qty 1

## 2019-10-21 MED ORDER — POTASSIUM CHLORIDE CRYS ER 20 MEQ PO TBCR
40.0000 meq | EXTENDED_RELEASE_TABLET | Freq: Two times a day (BID) | ORAL | Status: DC
  Filled 2019-10-21 (×2): qty 2

## 2019-10-21 NOTE — Plan of Care (Signed)
  Problem: Cardiac: Goal: Ability to achieve and maintain adequate cardiopulmonary perfusion will improve Outcome: Completed/Met   Problem: Clinical Measurements: Goal: Ability to maintain clinical measurements within normal limits will improve Outcome: Completed/Met

## 2019-10-21 NOTE — Progress Notes (Signed)
[facility] PROGRESS NOTE  [name] [name] ZOX:[id number] DOB: [birth year] DOA: 10/19/2019 PCP: Fransisca Connors, PA-C  Brief Summary: HPI: [name] [name] is a 68 y.o. female with medical history significant of hypertension, hyperlipidemia, COPD, diastolic congestive heart failure last EF 64 to 60% with grade 2 diastolic dysfunction in 06/1190, hypothyroidism, CVA, depression, sleep apnea, dementia, and renal insufficiency.   She presents with complaints of chest tightness starting yesterday evening while trying to sleep.  History is obtained mostly from the patient's husband as she does have history of dementia.  Normally patient reports that she is not on oxygen.  Associated symptoms to include dyspnea on exertion, some diaphoresis, intermittent leg swelling, and husband reported wheezing at night.  Denies having chest pain, fever, chills, nausea, vomiting, abdominal pain, recent sick contacts, or significant weight gain.  She has never smoked, but reports significant secondary smoke exposure while growing up.  She reports a history of congestive heart failure previously followed by cardiology at [facility], but has not seen them in about 2 years.    ED Course:  At [facility] patient was found to visibly working to breathe.  Patient was noted to be afebrile, pulse 38-66, respirations 18-28, blood pressures 149/97-180 5/65, and O2 saturations 95-100% on 2 L of nasal cannula oxygen for comfort BNP 552.4 and initial troponin 30. Chest x-ray revealed stable cardiomegaly and concern for diffuse interstitial edema versus pneumonitis. Patient was given 40 mg of Lasix IV.  COVID-19 screening was negative.TRH called to admit and was accepted to a telemetry bed as inpatient.  Assessment/Plan: Principal Problem:   Acute exacerbation of CHF (congestive heart failure) (HCC) Active Problems:   HYPERCHOLESTEROLEMIA-PURE   HYPOKALEMIA   Anxiety and depression   Essential hypertension  Hypothyroidism   Bradycardia   Precordial pain  Acute exacerbation of CHF  1.5 L diuresis yesterday on 40 mg of IV Lasix twice daily.  Has improved shortness of breath  Hypokalemic  replace potassium  Chest pain  Improved chest pain.  Remains on heparin for chest pain and increasing troponins.  Troponin trend: 30>33>55>65>33  Patient scheduled for stress test yesterday: Moderate sized fixed defect in the mid ventricle to apex with possible peri-infarct ischemia on the anterior septal margin.   Hypertension  BP controlled on labetalol, losartan  Hypothyroidism  On Synthroid   Code Status: Full code Family Communication: None  Disposition Plan: Patient should be able to return home   Consultants:  Cardiology  Procedures:  Nuclear stress test on 12/13  Antibiotics:  None  HPI/Subjective: Patient denies chest pain.  No shortness of breath.  No bleeding symptoms.  Objective: Vitals:   10/21/19 0605 10/21/19 0943  BP: 120/62   Pulse: 67   Resp: 16   Temp: 98.7 F (37.1 C)   SpO2: 97% 99%    Intake/Output Summary (Last 24 hours) at 10/21/2019 1344 Last data filed at 10/21/2019 1009 Gross per 24 hour  Intake 1035.48 ml  Output 150 ml  Net 885.48 ml   Filed Weights   10/19/19 1328 10/20/19 0551 10/21/19 0605  Weight: 84.1 kg 83.8 kg 83.1 kg    Exam:   General: Alert and oriented x3.  No acute distress.  Cardiovascular: Regular rate, and normal S1-S2 sounds.  No murmurs.  Respiratory: Slight Rales in bases.  Normal effort.  Abdomen: Soft, nontender, nondistended.  Musculoskeletal: No contractures, focal neurological deficit.  Extremity: Trace edema.  Warm extremities with 2+ dorsalis pedis and radial pulses.  Data  Reviewed: Basic Metabolic Panel: Recent Labs  Lab 10/19/19 0758 10/20/19 0152 10/21/19 0423  NA 137 139 139  K 3.6 3.1* 3.9  CL 107 102 105  CO2 _0 GLUCOSE 90 111* 101*  BUN _1 CREATININE 0.92 0.97  1.11*  CALCIUM 8.8* 8.8* 8.9   Liver Function Tests: Recent Labs  Lab 10/19/19 0758  AST 11*  ALT 8  ALKPHOS 91  BILITOT 1.2  PROT 7.1  ALBUMIN 4.3   No results for input(s): LIPASE, AMYLASE in the last 168 hours. No results for input(s): AMMONIA in the last 168 hours. CBC: Recent Labs  Lab 10/19/19 0758 10/20/19 0152 10/21/19 0423  WBC 7.5 7.2 6.7  NEUTROABS 5.8 5.3  --   HGB 11.2* 11.7* 11.8*  HCT 34.8* 35.1* 35.7*  MCV 85.7 83.2 83.8  PLT 155 163 178   Cardiac Enzymes: No results for input(s): CKTOTAL, CKMB, CKMBINDEX, TROPONINI in the last 168 hours. BNP (last 3 results) Recent Labs    10/19/19 0758  BNP 552.4*    ProBNP (last 3 results) No results for input(s): PROBNP in the last 8760 hours.  CBG: No results for input(s): GLUCAP in the last 168 hours.  Recent Results (from the past 240 hour(s))  SARS Coronavirus 2 Ag (30 min TAT) - Nasal Swab (BD Veritor Kit)     Status: None   Collection Time: 10/19/19  7:58 AM   Specimen: Nasal Swab (BD Veritor Kit)  Result Value Ref Range Status   SARS Coronavirus 2 Ag NEGATIVE NEGATIVE Final    Comment: (NOTE) SARS-CoV-2 antigen NOT DETECTED.  Negative results are presumptive.  Negative results do not preclude SARS-CoV-2 infection and should not be used as the sole basis for treatment or other patient management decisions, including infection  control decisions, particularly in the presence of clinical signs and  symptoms consistent with COVID-19, or in those who have been in contact with the virus.  Negative results must be combined with clinical observations, patient history, and epidemiological information. The expected result is Negative. Fact Sheet for Patients: PodPark.tn Fact Sheet for Healthcare Providers: GiftContent.is This test is not yet approved or cleared by the [place] FDA and  has been authorized for detection and/or diagnosis of  SARS-CoV-2 by FDA under an Emergency Use Authorization (EUA).  This EUA will remain in effect (meaning this test can be used) for the duration of  the COVID-19 de claration under Section 564(b)(1) of the Act, 21 U.S.C. section 360bbb-3(b)(1), unless the authorization is terminated or revoked sooner. Performed at [facility], [address] [place], [place] [address]   SARS CORONAVIRUS 2 (TAT 6-24 HRS) Nasopharyngeal Nasopharyngeal Swab     Status: None   Collection Time: 10/19/19  9:54 AM   Specimen: Nasopharyngeal Swab  Result Value Ref Range Status   SARS Coronavirus 2 NEGATIVE NEGATIVE Final    Comment: (NOTE) SARS-CoV-2 target nucleic acids are NOT DETECTED. The SARS-CoV-2 RNA is generally detectable in upper and lower respiratory specimens during the acute phase of infection. Negative results do not preclude SARS-CoV-2 infection, do not rule out co-infections with other pathogens, and should not be used as the sole basis for treatment or other patient management decisions. Negative results must be combined with clinical observations, patient history, and epidemiological information. The expected result is Negative. Fact Sheet for Patients: SugarRoll.be Fact Sheet for Healthcare Providers: https://www.woods-mathews.com/ This test is not yet approved or cleared by the [place] FDA  and  has been authorized for detection and/or diagnosis of SARS-CoV-2 by FDA under an Emergency Use Authorization (EUA). This EUA will remain  in effect (meaning this test can be used) for the duration of the COVID-19 declaration under Section 56 4(b)(1) of the Act, 21 U.S.C. section 360bbb-3(b)(1), unless the authorization is terminated or revoked sooner. Performed at [facility], [place] [address], [place], [place] [address]      Studies: NM Myocar Multi W/Spect W/Wall Motion / EF  Result Date: 10/20/2019 CLINICAL DATA:   68 year old female with a history of shortness of breath and nausea EXAM: MYOCARDIAL IMAGING WITH SPECT (REST AND PHARMACOLOGIC-STRESS) GATED LEFT VENTRICULAR WALL MOTION STUDY LEFT VENTRICULAR EJECTION FRACTION TECHNIQUE: Standard myocardial SPECT imaging was performed after resting intravenous injection of 10.5 mCi Tc-8mtetrofosmin. Subsequently, intravenous infusion of Lexiscan was performed under the supervision of the Cardiology staff. At peak effect of the drug, 32.3 mCi Tc-973metrofosmin was injected intravenously and standard myocardial SPECT imaging was performed. Quantitative gated imaging was also performed to evaluate left ventricular wall motion, and estimate left ventricular ejection fraction. COMPARISON:  None. FINDINGS: Perfusion: Fixed defect involving the septal wall of moderate-size mid ventricle to apex. The perfusion images demonstrate questionable peri-infarct ischemia/reversibility at the anterior septal margin of the observed infarct. Wall Motion: Normal left ventricular wall motion. No left ventricular dilation. Left Ventricular Ejection Fraction: 54 % End diastolic volume 11212l End systolic volume 53 ml IMPRESSION: 1. Moderate-sized fixed defect mid ventricle to apex with questionable region of peri-infarct ischemia at the anterior septal margin. 2. Normal left ventricular wall motion. 3. Left ventricular ejection fraction 54% 4. Non invasive risk stratification*: Low *2012 Appropriate Use Criteria for Coronary Revascularization Focused Update: J Am Coll Cardiol. 202482;50(0):370-488http://content.onairportbarriers.comspx?articleid=[email or web address] Electronically Signed   By: JaCorrie Mckusick.O.   On: 10/20/2019 15:18   ECHOCARDIOGRAM COMPLETE  Result Date: 10/20/2019   ECHOCARDIOGRAM REPORT   Patient Name:   [name] GESELLate of Exam: 10/20/2019 Medical Rec #:  [id number]   Height:       62.0 in Accession #:    [id number]  Weight:       184.7 lb Date of Birth:  07/1951-02-22    BSA:           1.85 m Patient Age:    [age]      BP:           147/99 mmHg Patient Gender: F             HR:           70 bpm. Exam Location:  Inpatient Procedure: 2D Echo Indications:    CHF-Acute Diastolic 42179.15/A56.97History:        Patient has prior history of Echocardiogram examinations, most                 recent 06/10/2010. COPD; Risk Factors:Hypertension, Dyslipidemia                 and Sleep Apnea. Hx CVA.  Sonographer:    ArClayton LefortDCS (AE) Referring Phys: [id number] A SMITH IMPRESSIONS  1. Left ventricular ejection fraction, by visual estimation, is 55 to 60%. The left ventricle has normal function. There is mildly increased left ventricular hypertrophy.  2. Left ventricular diastolic parameters are consistent with Grade I diastolic dysfunction (impaired relaxation).  3. The left ventricle has no regional wall motion abnormalities.  4. Global right  ventricle has normal systolic function.The right ventricular size is normal. No increase in right ventricular wall thickness.  5. Left atrial size was mildly dilated.  6. Right atrial size was normal.  7. Moderate pericardial effusion.  8. Presence of pericardial fat pad.  9. The pericardial effusion is circumferential. 10. Mild mitral annular calcification. 11. The mitral valve is degenerative. No evidence of mitral valve regurgitation. 12. The tricuspid valve is grossly normal. Tricuspid valve regurgitation is trivial. 13. The aortic valve is tricuspid. Aortic valve regurgitation is not visualized. No evidence of aortic valve sclerosis or stenosis. 14. The pulmonic valve was grossly normal. Pulmonic valve regurgitation is not visualized. 15. TR signal is inadequate for assessing pulmonary artery systolic pressure. 16. No prior Echocardiogram. FINDINGS  Left Ventricle: Left ventricular ejection fraction, by visual estimation, is 55 to 60%. The left ventricle has normal function. The left ventricle has no regional wall motion abnormalities. The left  ventricular internal cavity size was the left ventricle is normal in size. There is mildly increased left ventricular hypertrophy. Concentric left ventricular hypertrophy. Left ventricular diastolic parameters are consistent with Grade I diastolic dysfunction (impaired relaxation). Right Ventricle: The right ventricular size is normal. No increase in right ventricular wall thickness. Global RV systolic function is has normal systolic function. Left Atrium: Left atrial size was mildly dilated. Right Atrium: Right atrial size was normal in size Pericardium: A moderately sized pericardial effusion is present. The pericardial effusion is circumferential. There is no evidence of cardiac tamponade. Presence of pericardial fat pad. Mitral Valve: The mitral valve is degenerative in appearance. Mild mitral annular calcification. No evidence of mitral valve regurgitation. Tricuspid Valve: The tricuspid valve is grossly normal. Tricuspid valve regurgitation is trivial. Aortic Valve: The aortic valve is tricuspid. Aortic valve regurgitation is not visualized. The aortic valve is structurally normal, with no evidence of sclerosis or stenosis. Aortic valve mean gradient measures 6.4 mmHg. Aortic valve peak gradient measures 12.9 mmHg. Aortic valve area, by VTI measures 2.77 cm. Pulmonic Valve: The pulmonic valve was grossly normal. Pulmonic valve regurgitation is not visualized. Pulmonic regurgitation is not visualized. Aorta: The aortic root is normal in size and structure. Venous: The inferior vena cava was not well visualized. IAS/Shunts: No atrial level shunt detected by color flow Doppler.  LEFT VENTRICLE PLAX 2D LVIDd:         4.50 cm LVIDs:         3.10 cm LV PW:         1.40 cm LV IVS:        1.40 cm LVOT diam:     2.00 cm LV SV:         55 ml LV SV Index:   27.95 LVOT Area:     3.14 cm  RIGHT VENTRICLE RV Basal diam:  2.60 cm RV S prime:     12.00 cm/s TAPSE (M-mode): 2.7 cm LEFT ATRIUM             Index       RIGHT  ATRIUM           Index LA diam:        3.40 cm 1.84 cm/m  RA Area:     13.10 cm LA Vol (A2C):   84.4 ml 45.67 ml/m RA Volume:   29.10 ml  15.75 ml/m LA Vol (A4C):   89.7 ml 48.54 ml/m LA Biplane Vol: 87.6 ml 47.40 ml/m  AORTIC VALVE AV Area (Vmax):    2.63 cm AV  Area (Vmean):   2.58 cm AV Area (VTI):     2.77 cm AV Vmax:           179.60 cm/s AV Vmean:          117.400 cm/s AV VTI:            0.351 m AV Peak Grad:      12.9 mmHg AV Mean Grad:      6.4 mmHg LVOT Vmax:         150.60 cm/s LVOT Vmean:        96.240 cm/s LVOT VTI:          0.309 m LVOT/AV VTI ratio: 0.88  AORTA Ao Root diam: 3.10 cm  SHUNTS Systemic VTI:  0.31 m Systemic Diam: 2.00 cm  Eleonore Chiquito MD Electronically signed by Eleonore Chiquito MD Signature Date/Time: 10/20/2019/5:41:44 PM    Final     Scheduled Meds: . atorvastatin  20 mg Oral Daily  . clopidogrel  75 mg Oral Daily  . donepezil  10 mg Oral QHS  . fluticasone furoate-vilanterol  1 puff Inhalation Daily  . furosemide  40 mg Intravenous BID  . gabapentin  300 mg Oral BID  . gabapentin  600 mg Oral QHS  . labetalol  250 mg Oral BID  . levothyroxine  50 mcg Oral QAC breakfast  . losartan  50 mg Oral Daily  . minoxidil  10 mg Oral Daily  . potassium chloride  40 mEq Oral TID  . primidone  50 mg Oral QHS  . sertraline  100 mg Oral Daily  . sodium chloride flush  3 mL Intravenous Q12H  . topiramate  50 mg Oral BID   Continuous Infusions: . sodium chloride    . heparin 1,350 Units/hr (10/21/19 0174)      Time spent: [place]   10/21/2019, 1:44 PM  LOS: 2 days

## 2019-10-21 NOTE — Progress Notes (Signed)
ANTICOAGULATION CONSULT NOTE  Pharmacy Consult:  Heparin Indication: chest pain/ACS  Allergies  Allergen Reactions  . Other Other (See Comments)    PHENYLEDIAMINE - CAUSES ANGIO EDEMA  . Beta Adrenergic Blockers   . Cephalexin Itching  . Codeine Itching  . Meperidine Hcl   . Ace Inhibitors Cough    Coughing.   . Levofloxacin Other (See Comments)    Urine burned with odor    Patient Measurements: Height: 5\' 2"  (157.5 cm) Weight: 184 lb 11.2 oz (83.8 kg) IBW/kg (Calculated) : 50.1 Heparin Dosing Weight: 69 kg  Vital Signs: Temp: 98.2 F (36.8 C) (12/13 2102) Temp Source: Oral (12/13 2102) BP: 124/50 (12/13 2102) Pulse Rate: 64 (12/14 0340)  Labs: Recent Labs    10/19/19 0758 10/19/19 0758 10/19/19 1534 10/19/19 1708 10/20/19 0152 10/20/19 0612 10/20/19 1335 10/20/19 2048 10/21/19 0423  HGB 11.2*  --   --   --  11.7*  --   --   --  11.8*  HCT 34.8*  --   --   --  35.1*  --   --   --  35.7*  PLT 155  --   --   --  163  --   --   --  178  HEPARINUNFRC  --    < >  --   --  0.38  --  0.12* 0.22* 0.27*  CREATININE 0.92  --   --   --  0.97  --   --   --  1.11*  TROPONINIHS 30*  --  55* 65*  --  33*  --   --   --    < > = values in this interval not displayed.    Estimated Creatinine Clearance: 48.7 mL/min (A) (by C-G formula based on SCr of 1.11 mg/dL (H)).   Assessment: 31 YOF presented to [facility] with chest tightness and SOB, and then transferred to [facility] for further care.  Troponin is trending up so Pharmacy has been consulted to dose IV heparin for ACS.    Heparin level is slightly subtherapeutic (0.27) on gtt at 1200 units/hr.  No issue with infusion per RN.  Goal of Therapy:  Heparin level 0.3-0.7 units/ml Monitor platelets by anticoagulation protocol: Yes   Plan:  Increase heparin gtt to 1350 units/hr Will f/u 6 hr heparin level  Sherlon Handing, PharmD, BCPS Please see amion for complete clinical pharmacist phone list 10/21/2019, 5:34 AM

## 2019-10-21 NOTE — Progress Notes (Signed)
Subjective:  No chest pain.,  Breathing improved, but still requires oxygen.   Objective:  Vital Signs in the last 24 hours: Temp:  [97.9 F (36.6 C)-98.7 F (37.1 C)] 98.7 F (37.1 C) (12/14 0605) Pulse Rate:  [64-74] 67 (12/14 0605) Resp:  [16] 16 (12/14 0605) BP: (120-124)/(50-62) 120/62 (12/14 0605) SpO2:  [91 %-99 %] 99 % (12/14 0943) Weight:  [83.1 kg] 83.1 kg (12/14 0605)  Intake/Output from previous day: 12/13 0701 - 12/14 0700 In: 1035.5 [P.O.:720; I.V.:315.5] Out: 200 [Urine:200]  Physical Exam  Constitutional: She is oriented to person, place, and time. She appears well-developed and well-nourished. No distress.  HENT:  Head: Normocephalic and atraumatic.  Eyes: Pupils are equal, round, and reactive to light. Conjunctivae are normal.  Neck: No JVD present.  Cardiovascular: Normal rate, regular rhythm and intact distal pulses.  No murmur heard. Pulmonary/Chest: Effort normal and breath sounds normal. She has no wheezes. She has no rales.  Abdominal: Soft. Bowel sounds are normal. There is no rebound.  Musculoskeletal:        General: No edema.  Lymphadenopathy:    She has no cervical adenopathy.  Neurological: She is alert and oriented to person, place, and time. No cranial nerve deficit.  Skin: Skin is warm and dry.  Psychiatric: She has a normal mood and affect.  Nursing note and vitals reviewed.    Lab Results: BMP Recent Labs    10/19/19 0758 10/20/19 0152 10/21/19 0423  NA 137 139 139  K 3.6 3.1* 3.9  CL 107 102 105  CO2 23 24 23   GLUCOSE 90 111* 101*  BUN 11 11 14   CREATININE 0.92 0.97 1.11*  CALCIUM 8.8* 8.8* 8.9  GFRNONAA >60 >60 51*  GFRAA >60 >60 59*    CBC Recent Labs  Lab 10/20/19 0152 10/21/19 0423  WBC 7.2 6.7  RBC 4.22 4.26  HGB 11.7* 11.8*  HCT 35.1* 35.7*  PLT 163 178  MCV 83.2 83.8  MCH 27.7 27.7  MCHC 33.3 33.1  RDW 13.3 13.4  LYMPHSABS 1.1  --   MONOABS 0.7  --   EOSABS 0.1  --   BASOSABS 0.0  --      HEMOGLOBIN A1C Lab Results  Component Value Date   HGBA1C  08/13/2008    5.5 (NOTE)   The ADA recommends the following therapeutic goal for glycemic   control related to Hgb A1C measurement:   Goal of Therapy:   < 7.0% Hgb A1C   Reference: American Diabetes Association: Clinical Practice   Recommendations 2008, Diabetes Care,  2008, 31:(Suppl 1).   MPG 111 08/13/2008    Cardiac Panel (last 3 results) No results for input(s): CKTOTAL, CKMB, TROPONINI, RELINDX in the last 8760 hours.  BNP (last 3 results) Recent Labs    10/19/19 0758  BNP 552.4*    TSH No results for input(s): TSH in the last 8760 hours.  Lipid Panel     Component Value Date/Time   CHOL  08/13/2008 0615    165        ATP III CLASSIFICATION:  <200     mg/dL   Desirable  200-239  mg/dL   Borderline High  >=240    mg/dL   High   TRIG 162 (H) 08/13/2008 0615   HDL 28 (L) 08/13/2008 0615   CHOLHDL 5.9 08/13/2008 0615   VLDL 32 08/13/2008 0615   LDLCALC (H) 08/13/2008 0615    105        Total  Cholesterol/HDL:CHD Risk Coronary Heart Disease Risk Table                     Men   Women  1/2 Average Risk   3.4   3.3     Hepatic Function Panel Recent Labs    10/19/19 0758  PROT 7.1  ALBUMIN 4.3  AST 11*  ALT 8  ALKPHOS 91  BILITOT 1.2    Cardiac Studies:  EKG 10/19/2019: Sinus rhythm 59 bpm.  Baseline wander.  Nonspecific T wave abnormalities anterior leads.   Nuclear stress test 10/20/2019: 1. Moderate-sized fixed defect mid ventricle to apex with questionable region of peri-infarct ischemia at the anterior septal margin. 2. Normal left ventricular wall motion. 3. Left ventricular ejection fraction 54% 4. Non invasive risk stratification*: Low  Echocardiogram 10/20/2019: LVEF 55-60%. Grade 1 DD.  Mild LA dilatation. I have personally reviewed the echocardiogram. I believe the partially echodense finding seen anterior and posterior to the ventricles is pericardial fat, rather than  pericardial effusion.     Assessment & Recommendations:  68 y.o. Caucasian female  with hypertension, hyperlipidemia, history of stroke, HFpEF, tobacco abuse, COPD, hypothyroidism, transferred from med [facility] for management of chest pain or shortness of breath.  Shortness of breath: Likely hypertensive urgency and acute on chronic diastolic heart failure.  Reviewed echocardiogram. Details above. Will repeat echocardiogram outpatient in 6 weeks.  Given that she still required oxygen, recommend one more day of diuresis.  Currently on lasix IV 40 mg bid.Switch to PO torsemide 20 mg bid.   Hypertension: Better controlled now. On labetalol, Imdur, and minoxidil. Recommend losartan 50 mg, in lieu of minoxidil, given her HFpEF diagnosis. Okay to resume rest of the home meds.   Chest pain, troponin elevation:  Not ACS. Low risk nuclear stress test findings. Continue medical management.     Elder Negus, M.D. [facility] Pager: [phone] Office: [phone]

## 2019-10-21 NOTE — Progress Notes (Signed)
ANTICOAGULATION CONSULT NOTE  Pharmacy Consult:  Heparin Indication: chest pain/ACS  Allergies  Allergen Reactions  . Other Other (See Comments)    PHENYLEDIAMINE - CAUSES ANGIO EDEMA  . Beta Adrenergic Blockers   . Cephalexin Itching  . Codeine Itching  . Meperidine Hcl   . Ace Inhibitors Cough    Coughing.   . Levofloxacin Other (See Comments)    Urine burned with odor    Patient Measurements: Height: 5\' 2"  (157.5 cm) Weight: 183 lb 1.6 oz (83.1 kg) IBW/kg (Calculated) : 50.1 Heparin Dosing Weight: 69 kg  Vital Signs: Temp: 98.2 F (36.8 C) (12/14 1548) Temp Source: Oral (12/14 1548) BP: 111/46 (12/14 1548) Pulse Rate: 67 (12/14 0605)  Labs: Recent Labs    10/19/19 0758 10/19/19 0758 10/19/19 1534 10/19/19 1708 10/20/19 0152 10/20/19 0612 10/20/19 2048 10/21/19 0423 10/21/19 1200  HGB 11.2*  --   --   --  11.7*  --   --  11.8*  --   HCT 34.8*  --   --   --  35.1*  --   --  35.7*  --   PLT 155  --   --   --  163  --   --  178  --   HEPARINUNFRC  --    < >  --   --  0.38  --  0.22* 0.27* 0.33  CREATININE 0.92  --   --   --  0.97  --   --  1.11*  --   TROPONINIHS 30*  --  55* 65*  --  33*  --   --   --    < > = values in this interval not displayed.    Estimated Creatinine Clearance: 48.5 mL/min (A) (by C-G formula based on SCr of 1.11 mg/dL (H)).   Assessment: 69 YOF presented to [facility] with chest tightness and SOB, and then transferred to [facility] for further care.  Troponin is trending up so Pharmacy has been consulted to dose IV heparin for ACS.    Heparin level is at goal 0.3  on gtt at 1350 units/hr.  No issue with infusion per RN.  Goal of Therapy:  Heparin level 0.3-0.7 units/ml Monitor platelets by anticoagulation protocol: Yes   Plan:  Continue heparin gtt to 1350 units/hr Daily HL, CBC  Bonnita Nasuti Pharm.D. CPP, BCPS Clinical Pharmacist [phone] 10/21/2019 4:05 PM

## 2019-10-22 LAB — BASIC METABOLIC PANEL
Anion gap: 10 (ref 5–15)
BUN: 12 mg/dL (ref 8–23)
CO2: 25 mmol/L (ref 22–32)
Calcium: 9 mg/dL (ref 8.9–10.3)
Chloride: 105 mmol/L (ref 98–111)
Creatinine, Ser: 1.08 mg/dL — ABNORMAL HIGH (ref 0.44–1.00)
GFR calc Af Amer: >60 mL/min (ref >60)
GFR calc non Af Amer: 53 mL/min — ABNORMAL LOW (ref >60)
Glucose, Bld: 98 mg/dL (ref 70–99)
Potassium: 3.7 mmol/L (ref 3.5–5.1)
Sodium: 140 mmol/L (ref 135–145)

## 2019-10-22 LAB — HEPARIN LEVEL (UNFRACTIONATED): Heparin Unfractionated: 0.43 IU/mL (ref 0.30–0.70)

## 2019-10-22 LAB — CBC
HCT: 36.2 % (ref 36.0–46.0)
Hemoglobin: 11.8 g/dL — ABNORMAL LOW (ref 12.0–15.0)
MCH: 27.5 pg (ref 26.0–34.0)
MCHC: 32.6 g/dL (ref 30.0–36.0)
MCV: 84.4 fL (ref 80.0–100.0)
Platelets: 200 10*3/uL (ref 150–400)
RBC: 4.29 MIL/uL (ref 3.87–5.11)
RDW: 13.5 % (ref 11.5–15.5)
WBC: 6.8 10*3/uL (ref 4.0–10.5)
nRBC: 0 % (ref 0.0–0.2)

## 2019-10-22 MED ORDER — RIVASTIGMINE TARTRATE 1.5 MG PO CAPS
4.5000 mg | ORAL_CAPSULE | Freq: Two times a day (BID) | ORAL | Status: DC
  Filled 2019-10-22 (×2): qty 3

## 2019-10-22 MED ORDER — LOSARTAN POTASSIUM 50 MG PO TABS: 50.0000 mg | ORAL_TABLET | Freq: Every day | ORAL | 0 refills | Status: AC

## 2019-10-22 MED ORDER — TORSEMIDE 20 MG PO TABS: 20.0000 mg | ORAL_TABLET | Freq: Every day | ORAL | 0 refills | Status: AC

## 2019-10-22 MED ORDER — GABAPENTIN 300 MG PO CAPS: 300.0000 mg | ORAL_CAPSULE | Freq: Two times a day (BID) | ORAL | Status: DC

## 2019-10-22 MED ORDER — LABETALOL HCL 200 MG PO TABS: 250.0000 mg | ORAL_TABLET | Freq: Two times a day (BID) | ORAL | Status: DC

## 2019-10-22 MED ORDER — PANTOPRAZOLE SODIUM 40 MG PO TBEC
40.0000 mg | DELAYED_RELEASE_TABLET | Freq: Every day | ORAL | Status: DC
  Administered 2019-10-22: 40 mg via ORAL
  Filled 2019-10-22: qty 1

## 2019-10-22 NOTE — Discharge Instructions (Signed)
Heart Failure, Diagnosis  Heart failure means that your heart is not able to pump blood in the right way. This makes it hard for your body to work well. Heart failure is usually a long-term (chronic) condition. You must take good care of yourself and follow your treatment plan from your doctor. What are the causes? This condition may be caused by:  High blood pressure.  Build up of cholesterol and fat in the arteries.  Heart attack. This injures the heart muscle.  Heart valves that do not open and close properly.  Damage of the heart muscle. This is also called cardiomyopathy.  Lung disease.  Abnormal heart rhythms. What increases the risk? The risk of heart failure goes up as a person ages. This condition is also more likely to develop in people who:  Are overweight.  Are female.  Smoke or chew tobacco.  Abuse alcohol or illegal drugs.  Have taken medicines that can damage the heart.  Have diabetes.  Have abnormal heart rhythms.  Have thyroid problems.  Have low blood counts (anemia). What are the signs or symptoms? Symptoms of this condition include:  Shortness of breath.  Coughing.  Swelling of the feet, ankles, legs, or belly.  Losing weight for no reason.  Trouble breathing.  Waking from sleep because of the need to sit up and get more air.  Rapid heartbeat.  Being very tired.  Feeling dizzy, or feeling like you may pass out (faint).  Having no desire to eat.  Feeling like you may vomit (nauseous).  Peeing (urinating) more at night.  Feeling confused. How is this treated?     This condition may be treated with:  Medicines. These can be given to treat blood pressure and to make the heart muscles stronger.  Changes in your daily life. These may include eating a healthy diet, staying at a healthy body weight, quitting tobacco and illegal drug use, or doing exercises.  Surgery. Surgery can be done to open blocked valves, or to put devices in  the heart, such as pacemakers.  A donor heart (heart transplant). You will receive a healthy heart from a donor. Follow these instructions at home:  Treat other conditions as told by your doctor. These may include high blood pressure, diabetes, thyroid disease, or abnormal heart rhythms.  Learn as much as you can about heart failure.  Get support as you need it.  Keep all follow-up visits as told by your doctor. This is important. Summary  Heart failure means that your heart is not able to pump blood in the right way.  This condition is caused by high blood pressure, heart attack, or damage of the heart muscle.  Symptoms of this condition include shortness of breath and swelling of the feet, ankles, legs, or belly. You may also feel very tired or feel like you may vomit.  You may be treated with medicines, surgery, or changes in your daily life.  Treat other health conditions as told by your doctor. This information is not intended to replace advice given to you by your health care provider. Make sure you discuss any questions you have with your health care provider. Document Released: 08/02/2008 Document Revised: 01/11/2019 Document Reviewed: 01/11/2019 Elsevier Patient Education  [place] Heart-healthy meal planning includes:  Eating less unhealthy fats.  Eating more healthy fats.  Making other changes in your diet. Talk with your doctor or a diet specialist (dietitian) to create an eating plan  that is right for you. What is my plan? Your doctor may recommend an eating plan that includes:  Total fat: ______% or less of total calories a day.  Saturated fat: ______% or less of total calories a day.  Cholesterol: less than _________mg a day. What are tips for following this plan? Cooking Avoid frying your food. Try to bake, boil, grill, or broil it instead. You can also reduce fat by:  Removing the skin from poultry.  Removing  all visible fats from meats.  Steaming vegetables in water or broth. Meal planning   At meals, divide your plate into four equal parts: ? Fill one-half of your plate with vegetables and green salads. ? Fill one-fourth of your plate with whole grains. ? Fill one-fourth of your plate with lean protein foods.  Eat 4-5 servings of vegetables per day. A serving of vegetables is: ? 1 cup of raw or cooked vegetables. ? 2 cups of raw leafy greens.  Eat 4-5 servings of fruit per day. A serving of fruit is: ? 1 medium whole fruit. ?  cup of dried fruit. ?  cup of fresh, frozen, or canned fruit. ?  cup of 100% fruit juice.  Eat more foods that have soluble fiber. These are apples, broccoli, carrots, beans, peas, and barley. Try to get 20-30 g of fiber per day.  Eat 4-5 servings of nuts, legumes, and seeds per week: ? 1 serving of dried beans or legumes equals  cup after being cooked. ? 1 serving of nuts is  cup. ? 1 serving of seeds equals 1 tablespoon. General information  Eat more home-cooked food. Eat less restaurant, buffet, and fast food.  Limit or avoid alcohol.  Limit foods that are high in starch and sugar.  Avoid fried foods.  Lose weight if you are overweight.  Keep track of how much salt (sodium) you eat. This is important if you have high blood pressure. Ask your doctor to tell you more about this.  Try to add vegetarian meals each week. Fats  Choose healthy fats. These include olive oil and canola oil, flaxseeds, walnuts, almonds, and seeds.  Eat more omega-3 fats. These include salmon, mackerel, sardines, tuna, flaxseed oil, and ground flaxseeds. Try to eat fish at least 2 times each week.  Check food labels. Avoid foods with trans fats or high amounts of saturated fat.  Limit saturated fats. ? These are often found in animal products, such as meats, butter, and cream. ? These are also found in plant foods, such as palm oil, palm kernel oil, and coconut  oil.  Avoid foods with partially hydrogenated oils in them. These have trans fats. Examples are stick margarine, some tub margarines, cookies, crackers, and other baked goods. What foods can I eat? Fruits All fresh, canned (in natural juice), or frozen fruits. Vegetables Fresh or frozen vegetables (raw, steamed, roasted, or grilled). Green salads. Grains Most grains. Choose whole wheat and whole grains most of the time. Rice and pasta, including brown rice and pastas made with whole wheat. Meats and other proteins Lean, well-trimmed beef, veal, pork, and lamb. Chicken and [place] without skin. All fish and shellfish. Wild duck, rabbit, pheasant, and venison. Egg whites or low-cholesterol egg substitutes. Dried beans, peas, lentils, and tofu. Seeds and most nuts. Dairy Low-fat or nonfat cheeses, including ricotta and mozzarella. Skim or 1% milk that is liquid, powdered, or evaporated. Buttermilk that is made with low-fat milk. Nonfat or low-fat yogurt. Fats and oils Non-hydrogenated (trans-free) margarines.  Vegetable oils, including soybean, sesame, sunflower, olive, peanut, safflower, corn, canola, and cottonseed. Salad dressings or mayonnaise made with a vegetable oil. Beverages Mineral water. Coffee and tea. Diet carbonated beverages. Sweets and desserts Sherbet, gelatin, and fruit ice. Small amounts of dark chocolate. Limit all sweets and desserts. Seasonings and condiments All seasonings and condiments. The items listed above may not be a complete list of foods and drinks you can eat. Contact a dietitian for more options. What foods should I avoid? Fruits Canned fruit in heavy syrup. Fruit in cream or butter sauce. Fried fruit. Limit coconut. Vegetables Vegetables cooked in cheese, cream, or butter sauce. Fried vegetables. Grains Breads that are made with saturated or trans fats, oils, or whole milk. Croissants. Sweet rolls. Donuts. High-fat crackers, such as cheese crackers. Meats  and other proteins Fatty meats, such as hot dogs, ribs, sausage, bacon, rib-eye roast or steak. High-fat deli meats, such as salami and bologna. Caviar. Domestic duck and goose. Organ meats, such as liver. Dairy Cream, sour cream, cream cheese, and creamed cottage cheese. Whole-milk cheeses. Whole or 2% milk that is liquid, evaporated, or condensed. Whole buttermilk. Cream sauce or high-fat cheese sauce. Yogurt that is made from whole milk. Fats and oils Meat fat, or shortening. Cocoa butter, hydrogenated oils, palm oil, coconut oil, palm kernel oil. Solid fats and shortenings, including bacon fat, salt pork, lard, and butter. Nondairy cream substitutes. Salad dressings with cheese or sour cream. Beverages Regular sodas and juice drinks with added sugar. Sweets and desserts Frosting. Pudding. Cookies. Cakes. Pies. Milk chocolate or white chocolate. Buttered syrups. Full-fat ice cream or ice cream drinks. The items listed above may not be a complete list of foods and drinks to avoid. Contact a dietitian for more information. Summary  Heart-healthy meal planning includes eating less unhealthy fats, eating more healthy fats, and making other changes in your diet.  Eat a balanced diet. This includes fruits and vegetables, low-fat or nonfat dairy, lean protein, nuts and legumes, whole grains, and heart-healthy oils and fats. This information is not intended to replace advice given to you by your health care provider. Make sure you discuss any questions you have with your health care provider. Document Released: 04/24/2012 Document Revised: 12/28/2017 Document Reviewed: 12/01/2017 Elsevier Patient Education  2020 [employer].

## 2019-10-22 NOTE — Care Management Important Message (Signed)
Important Message  Patient Details  Name: [name] [name] MRN: [id number] Date of Birth: [birth year]   Medicare Important Message Given:  Yes     Shelda Altes 10/22/2019, 3:02 PM

## 2019-10-22 NOTE — Progress Notes (Signed)
Ok to discharge home on torsemide 20 mg daily. Will arrange outpatient follow up.  Nigel Mormon, MD [facility] [place] Pager: [phone] Office: [phone]

## 2019-10-22 NOTE — Plan of Care (Signed)
  Problem: Health Behavior/Discharge Planning: Goal: Ability to manage health-related needs will improve Outcome: Progressing   Problem: Clinical Measurements: Goal: Respiratory complications will improve Outcome: Progressing   Problem: Activity: Goal: Risk for activity intolerance will decrease Outcome: Progressing   Problem: Clinical Measurements: Goal: Will remain free from infection Outcome: Completed/Met Goal: Diagnostic test results will improve Outcome: Completed/Met Goal: Cardiovascular complication will be avoided Outcome: Completed/Met

## 2019-10-22 NOTE — Discharge Summary (Signed)
Physician Discharge Summary  [name] [name] ALP:[id number] DOB: 1950/12/31 DOA: 10/19/2019  PCP: Fransisca Connors, PA-C  Admit date: 10/19/2019 Discharge date: 10/22/2019  Admitted From: home Disposition:  home  Recommendations for Outpatient Follow-up:  1. Follow up with PCP in 1-2 weeks 2. Please obtain BMP/CBC in one week 3. Please follow up on the following pending results:  Home Health:n/a  Equipment/Devices:n/a   Discharge Condition:stable  CODE STATUS:full  Diet recommendation: heart healthy  Brief/Interim Summary: This is a 68 year old female with a history of systolic heart failure, hypertension, hypothyroidism, essential tremor.  Patient admitted with exacerbation of CHF with chest pain.  She had a rise in her high-sensitivity troponin and was started on heparin.  An echocardiogram next day an EF of 55 to 60% with grade 1 diastolic heart failure and no wall motion abnormalities.  She had a nuclear stress test on 12/13 showing a fixed defect involving the septal wall.  She is remained stable since that time.  Cardiology determined that the patient do not need heart cath.  Patient is remained stable off oxygen including when ambulating.  Patient will be discharged home on torsemide and will follow up with cardiology and primary care physician.  Discharge Diagnoses:  Principal Problem:   Acute exacerbation of CHF (congestive heart failure) (HCC) Active Problems:   HYPERCHOLESTEROLEMIA-PURE   HYPOKALEMIA   Anxiety and depression   Essential hypertension   Hypothyroidism   Bradycardia   Precordial pain    Discharge Instructions  Discharge Instructions    (HEART FAILURE PATIENTS) Call MD:  Anytime you have any of the following symptoms: 1) 3 pound weight gain in 24 hours or 5 pounds in 1 week 2) shortness of breath, with or without a dry hacking cough 3) swelling in the hands, feet or stomach 4) if you have to sleep on extra pillows at night in order to breathe.   Complete  by: As directed    Call MD for:  difficulty breathing, headache or visual disturbances   Complete by: As directed    Call MD for:  extreme fatigue   Complete by: As directed    Call MD for:  hives   Complete by: As directed    Call MD for:  persistant dizziness or light-headedness   Complete by: As directed    Call MD for:  persistant nausea and vomiting   Complete by: As directed    Call MD for:  redness, tenderness, or signs of infection (pain, swelling, redness, odor or green/yellow discharge around incision site)   Complete by: As directed    Call MD for:  severe uncontrolled pain   Complete by: As directed    Call MD for:  temperature >100.4   Complete by: As directed    Diet - low sodium heart healthy   Complete by: As directed    Increase activity slowly   Complete by: As directed      Allergies as of 10/22/2019      Reactions   Other Other (See Comments)   PHENYLEDIAMINE - CAUSES ANGIO EDEMA   Beta Adrenergic Blockers    Cephalexin Itching   Codeine Itching   Meperidine Hcl    Ace Inhibitors Cough   Coughing.    Levofloxacin Other (See Comments)   Urine burned with odor      Medication List    STOP taking these medications   furosemide 40 MG tablet Commonly known as: LASIX     TAKE these medications  albuterol 108 (90 Base) MCG/ACT inhaler Commonly known as: VENTOLIN HFA Inhale 2 puffs into the lungs every 6 (six) hours as needed for wheezing.   ALPRAZolam 0.5 MG tablet Commonly known as: XANAX Take 0.5 mg by mouth at bedtime as needed for sleep.   antipyrine-benzocaine OTIC solution Commonly known as: AURALGAN Place 2 drops into both ears as needed (ear infection). Reported on 05/04/2016   atorvastatin 20 MG tablet Commonly known as: LIPITOR Take 20 mg by mouth daily.   Breo Ellipta 100-25 MCG/INH Aepb Generic drug: fluticasone furoate-vilanterol Inhale 1 puff into the lungs daily.   cetirizine 10 MG tablet Commonly known as: ZYRTEC Take 10  mg by mouth daily as needed for allergies.   clopidogrel 75 MG tablet Commonly known as: PLAVIX Take 75 mg by mouth daily.   cyanocobalamin 1000 MCG/ML injection Commonly known as: (VITAMIN B-12) Inject 1 mL into the muscle every 30 (thirty) days.   EpiPen 2-Pak 0.3 mg/0.3 mL Soaj injection Generic drug: EPINEPHrine Inject 0.3 mg into the muscle as needed for anaphylaxis.   Fluticasone-Salmeterol 500-50 MCG/DOSE Aepb Commonly known as: ADVAIR Inhale 1 puff into the lungs every 12 (twelve) hours.   gabapentin 300 MG capsule Commonly known as: NEURONTIN Take 300-600 mg by mouth 2 (two) times daily. Take '300mg'$  in the morning and noon; then take '600mg'$  at bedtime What changed: Another medication with the same name was changed. Make sure you understand how and when to take each.   gabapentin 300 MG capsule Commonly known as: NEURONTIN Take 1 capsule (300 mg total) by mouth 2 (two) times daily. What changed:   medication strength  when to take this  additional instructions   isosorbide mononitrate 30 MG 24 hr tablet Commonly known as: IMDUR Take 60 mg by mouth daily.   labetalol 100 MG tablet Commonly known as: NORMODYNE Take 250 mg by mouth 2 (two) times daily. Dose reduced per husband   levothyroxine 50 MCG tablet Commonly known as: SYNTHROID Take 50 mcg by mouth daily before breakfast.   losartan 50 MG tablet Commonly known as: COZAAR Take 1 tablet (50 mg total) by mouth daily. Start taking on: October 23, 2019   minoxidil 10 MG tablet Commonly known as: LONITEN Take 10 mg by mouth daily.   nitroGLYCERIN 0.4 MG SL tablet Commonly known as: NITROSTAT Place 0.4 mg under the tongue every 5 (five) minutes as needed for chest pain.   ondansetron 4 MG disintegrating tablet Commonly known as: ZOFRAN-ODT Take 4 mg by mouth every 8 (eight) hours as needed for nausea.   pantoprazole 40 MG tablet Commonly known as: PROTONIX Take 40 mg by mouth daily.   primidone  50 MG tablet Commonly known as: MYSOLINE Take 50 mg by mouth daily.   Proctozone-HC 2.5 % rectal cream Generic drug: hydrocortisone Place 1 application rectally 2 (two) times daily as needed for hemorrhoids.   rivastigmine 4.5 MG capsule Commonly known as: EXELON Take 4.5 mg by mouth 2 (two) times daily.   sertraline 100 MG tablet Commonly known as: ZOLOFT Take 1 tablet (100 mg total) by mouth daily. What changed: how much to take   topiramate 50 MG tablet Commonly known as: TOPAMAX Take 50 mg by mouth 2 (two) times daily.   torsemide 20 MG tablet Commonly known as: DEMADEX Take 1 tablet (20 mg total) by mouth daily. Take extra dose in the evening if weight gain more than 3 pounds or short of breath   traMADol 50 MG tablet  Commonly known as: ULTRAM Take 50 mg by mouth every 6 (six) hours as needed for moderate pain.   Vitamin D (Ergocalciferol) 1.25 MG (50000 UT) Caps capsule Commonly known as: DRISDOL Take 50,000 Units by mouth once a week.      Follow-up Information    Fransisca Connors, PA-C Follow up in 2 week(s).   Specialty: Family Medicine Contact information: [address] [place] [address] [phone]          Allergies  Allergen Reactions  . Other Other (See Comments)    PHENYLEDIAMINE - CAUSES ANGIO EDEMA  . Beta Adrenergic Blockers   . Cephalexin Itching  . Codeine Itching  . Meperidine Hcl   . Ace Inhibitors Cough    Coughing.   . Levofloxacin Other (See Comments)    Urine burned with odor    Consultations:  Cardiology   Procedures/Studies: NM Myocar Multi W/Spect W/Wall Motion / EF  Result Date: 10/20/2019 CLINICAL DATA:  68 year old female with a history of shortness of breath and nausea EXAM: MYOCARDIAL IMAGING WITH SPECT (REST AND PHARMACOLOGIC-STRESS) GATED LEFT VENTRICULAR WALL MOTION STUDY LEFT VENTRICULAR EJECTION FRACTION TECHNIQUE: Standard myocardial SPECT imaging was performed after resting intravenous injection  of 10.5 mCi Tc-18mtetrofosmin. Subsequently, intravenous infusion of Lexiscan was performed under the supervision of the Cardiology staff. At peak effect of the drug, 32.3 mCi Tc-958metrofosmin was injected intravenously and standard myocardial SPECT imaging was performed. Quantitative gated imaging was also performed to evaluate left ventricular wall motion, and estimate left ventricular ejection fraction. COMPARISON:  None. FINDINGS: Perfusion: Fixed defect involving the septal wall of moderate-size mid ventricle to apex. The perfusion images demonstrate questionable peri-infarct ischemia/reversibility at the anterior septal margin of the observed infarct. Wall Motion: Normal left ventricular wall motion. No left ventricular dilation. Left Ventricular Ejection Fraction: 54 % End diastolic volume 11496l End systolic volume 53 ml IMPRESSION: 1. Moderate-sized fixed defect mid ventricle to apex with questionable region of peri-infarct ischemia at the anterior septal margin. 2. Normal left ventricular wall motion. 3. Left ventricular ejection fraction 54% 4. Non invasive risk stratification*: Low *2012 Appropriate Use Criteria for Coronary Revascularization Focused Update: J Am Coll Cardiol. 207591;63(8):466-599http://content.onairportbarriers.comspx?articleid=[email or web address] Electronically Signed   By: JaCorrie Mckusick.O.   On: 10/20/2019 15:18   DG Chest Portable 1 View  Result Date: 10/19/2019 CLINICAL DATA:  Shortness of breath. Chest tightness. EXAM: PORTABLE CHEST 1 VIEW COMPARISON:  08/17/2011 FINDINGS: Chronic cardiomegaly. Pulmonary vascularity is normal. Diffuse slight accentuation of the interstitial markings, new since the prior study. No acute bone abnormality. IMPRESSION: 1. New slight diffuse interstitial accentuation. This could represent interstitial edema or pneumonitis. 2. Stable chronic cardiomegaly since the prior study. Electronically Signed   By: JaLorriane Shire.D.   On: 10/19/2019 08:48    ECHOCARDIOGRAM COMPLETE  Result Date: 10/20/2019   ECHOCARDIOGRAM REPORT   Patient Name:   CAAKEYLA MOLDENate of Exam: 10/20/2019 Medical Rec #:  [id number]   Height:       62.0 in Accession #:    [id number]  Weight:       184.7 lb Date of Birth:  07/1951-08-19    BSA:          1.85 m Patient Age:    [age]      BP:           147/99 mmHg Patient Gender: F  HR:           70 bpm. Exam Location:  Inpatient Procedure: 2D Echo Indications:    CHF-Acute Diastolic 756.43/P29.51  History:        Patient has prior history of Echocardiogram examinations, most                 recent 06/10/2010. COPD; Risk Factors:Hypertension, Dyslipidemia                 and Sleep Apnea. Hx CVA.  Sonographer:    Clayton Lefort RDCS (AE) Referring Phys: [id number] RONDELL A SMITH IMPRESSIONS  1. Left ventricular ejection fraction, by visual estimation, is 55 to 60%. The left ventricle has normal function. There is mildly increased left ventricular hypertrophy.  2. Left ventricular diastolic parameters are consistent with Grade I diastolic dysfunction (impaired relaxation).  3. The left ventricle has no regional wall motion abnormalities.  4. Global right ventricle has normal systolic function.The right ventricular size is normal. No increase in right ventricular wall thickness.  5. Left atrial size was mildly dilated.  6. Right atrial size was normal.  7. Moderate pericardial effusion.  8. Presence of pericardial fat pad.  9. The pericardial effusion is circumferential. 10. Mild mitral annular calcification. 11. The mitral valve is degenerative. No evidence of mitral valve regurgitation. 12. The tricuspid valve is grossly normal. Tricuspid valve regurgitation is trivial. 13. The aortic valve is tricuspid. Aortic valve regurgitation is not visualized. No evidence of aortic valve sclerosis or stenosis. 14. The pulmonic valve was grossly normal. Pulmonic valve regurgitation is not visualized. 15. TR signal is inadequate for assessing  pulmonary artery systolic pressure. 16. No prior Echocardiogram. FINDINGS  Left Ventricle: Left ventricular ejection fraction, by visual estimation, is 55 to 60%. The left ventricle has normal function. The left ventricle has no regional wall motion abnormalities. The left ventricular internal cavity size was the left ventricle is normal in size. There is mildly increased left ventricular hypertrophy. Concentric left ventricular hypertrophy. Left ventricular diastolic parameters are consistent with Grade I diastolic dysfunction (impaired relaxation). Right Ventricle: The right ventricular size is normal. No increase in right ventricular wall thickness. Global RV systolic function is has normal systolic function. Left Atrium: Left atrial size was mildly dilated. Right Atrium: Right atrial size was normal in size Pericardium: A moderately sized pericardial effusion is present. The pericardial effusion is circumferential. There is no evidence of cardiac tamponade. Presence of pericardial fat pad. Mitral Valve: The mitral valve is degenerative in appearance. Mild mitral annular calcification. No evidence of mitral valve regurgitation. Tricuspid Valve: The tricuspid valve is grossly normal. Tricuspid valve regurgitation is trivial. Aortic Valve: The aortic valve is tricuspid. Aortic valve regurgitation is not visualized. The aortic valve is structurally normal, with no evidence of sclerosis or stenosis. Aortic valve mean gradient measures 6.4 mmHg. Aortic valve peak gradient measures 12.9 mmHg. Aortic valve area, by VTI measures 2.77 cm. Pulmonic Valve: The pulmonic valve was grossly normal. Pulmonic valve regurgitation is not visualized. Pulmonic regurgitation is not visualized. Aorta: The aortic root is normal in size and structure. Venous: The inferior vena cava was not well visualized. IAS/Shunts: No atrial level shunt detected by color flow Doppler.  LEFT VENTRICLE PLAX 2D LVIDd:         4.50 cm LVIDs:         3.10  cm LV PW:         1.40 cm LV IVS:        1.40  cm LVOT diam:     2.00 cm LV SV:         55 ml LV SV Index:   27.95 LVOT Area:     3.14 cm  RIGHT VENTRICLE RV Basal diam:  2.60 cm RV S prime:     12.00 cm/s TAPSE (M-mode): 2.7 cm LEFT ATRIUM             Index       RIGHT ATRIUM           Index LA diam:        3.40 cm 1.84 cm/m  RA Area:     13.10 cm LA Vol (A2C):   84.4 ml 45.67 ml/m RA Volume:   29.10 ml  15.75 ml/m LA Vol (A4C):   89.7 ml 48.54 ml/m LA Biplane Vol: 87.6 ml 47.40 ml/m  AORTIC VALVE AV Area (Vmax):    2.63 cm AV Area (Vmean):   2.58 cm AV Area (VTI):     2.77 cm AV Vmax:           179.60 cm/s AV Vmean:          117.400 cm/s AV VTI:            0.351 m AV Peak Grad:      12.9 mmHg AV Mean Grad:      6.4 mmHg LVOT Vmax:         150.60 cm/s LVOT Vmean:        96.240 cm/s LVOT VTI:          0.309 m LVOT/AV VTI ratio: 0.88  AORTA Ao Root diam: 3.10 cm  SHUNTS Systemic VTI:  0.31 m Systemic Diam: 2.00 cm  Eleonore Chiquito MD Electronically signed by Eleonore Chiquito MD Signature Date/Time: 10/20/2019/5:41:44 PM    Final        Subjective:   Discharge Exam: Vitals:   10/22/19 1052 10/22/19 1053  BP: (!) 138/59   Pulse:  68  Resp:    Temp:    SpO2:     Vitals:   10/22/19 0423 10/22/19 0804 10/22/19 1052 10/22/19 1053  BP: (!) 144/61  (!) 138/59   Pulse: 67   68  Resp:      Temp: 99.6 F (37.6 C)     TempSrc: Axillary     SpO2: 97% 98%    Weight: 84.8 kg     Height:         General: Pt is alert, awake, not in acute distress Cardiovascular: RRR, S1/S2 +, no rubs, no gallops Respiratory: CTA bilaterally, no wheezing, no rhonchi Abdominal: Soft, NT, ND, bowel sounds + Extremities: no edema, no cyanosis    The results of significant diagnostics from this hospitalization (including imaging, microbiology, ancillary and laboratory) are listed below for reference.     Microbiology: Recent Results (from the past 240 hour(s))  SARS Coronavirus 2 Ag (30 min TAT) - Nasal  Swab (BD Veritor Kit)     Status: None   Collection Time: 10/19/19  7:58 AM   Specimen: Nasal Swab (BD Veritor Kit)  Result Value Ref Range Status   SARS Coronavirus 2 Ag NEGATIVE NEGATIVE Final    Comment: (NOTE) SARS-CoV-2 antigen NOT DETECTED.  Negative results are presumptive.  Negative results do not preclude SARS-CoV-2 infection and should not be used as the sole basis for treatment or other patient management decisions, including infection  control decisions, particularly in the presence of clinical signs and  symptoms consistent with  COVID-19, or in those who have been in contact with the virus.  Negative results must be combined with clinical observations, patient history, and epidemiological information. The expected result is Negative. Fact Sheet for Patients: PodPark.tn Fact Sheet for Healthcare Providers: GiftContent.is This test is not yet approved or cleared by the [place] FDA and  has been authorized for detection and/or diagnosis of SARS-CoV-2 by FDA under an Emergency Use Authorization (EUA).  This EUA will remain in effect (meaning this test can be used) for the duration of  the COVID-19 de claration under Section 564(b)(1) of the Act, 21 U.S.C. section 360bbb-3(b)(1), unless the authorization is terminated or revoked sooner. Performed at [facility], [address] [place], [place] [address]   SARS CORONAVIRUS 2 (TAT 6-24 HRS) Nasopharyngeal Nasopharyngeal Swab     Status: None   Collection Time: 10/19/19  9:54 AM   Specimen: Nasopharyngeal Swab  Result Value Ref Range Status   SARS Coronavirus 2 NEGATIVE NEGATIVE Final    Comment: (NOTE) SARS-CoV-2 target nucleic acids are NOT DETECTED. The SARS-CoV-2 RNA is generally detectable in upper and lower respiratory specimens during the acute phase of infection. Negative results do not preclude SARS-CoV-2 infection, do not rule  out co-infections with other pathogens, and should not be used as the sole basis for treatment or other patient management decisions. Negative results must be combined with clinical observations, patient history, and epidemiological information. The expected result is Negative. Fact Sheet for Patients: SugarRoll.be Fact Sheet for Healthcare Providers: https://www.woods-mathews.com/ This test is not yet approved or cleared by the [place] FDA and  has been authorized for detection and/or diagnosis of SARS-CoV-2 by FDA under an Emergency Use Authorization (EUA). This EUA will remain  in effect (meaning this test can be used) for the duration of the COVID-19 declaration under Section 56 4(b)(1) of the Act, 21 U.S.C. section 360bbb-3(b)(1), unless the authorization is terminated or revoked sooner. Performed at [facility], [place] [address] [place], [place] [address]      Labs: BNP (last 3 results) Recent Labs    10/19/19 0758  BNP 662.9*   Basic Metabolic Panel: Recent Labs  Lab 10/19/19 0758 10/20/19 0152 10/21/19 0423 10/22/19 0432  NA 137 139 139 140  K 3.6 3.1* 3.9 3.7  CL 107 102 105 105  CO2 _0 GLUCOSE 90 111* 101* 98  BUN _1 CREATININE 0.92 0.97 1.11* 1.08*  CALCIUM 8.8* 8.8* 8.9 9.0   Liver Function Tests: Recent Labs  Lab 10/19/19 0758  AST 11*  ALT 8  ALKPHOS 91  BILITOT 1.2  PROT 7.1  ALBUMIN 4.3   No results for input(s): LIPASE, AMYLASE in the last 168 hours. No results for input(s): AMMONIA in the last 168 hours. CBC: Recent Labs  Lab 10/19/19 0758 10/20/19 0152 10/21/19 0423 10/22/19 0432  WBC 7.5 7.2 6.7 6.8  NEUTROABS 5.8 5.3  --   --   HGB 11.2* 11.7* 11.8* 11.8*  HCT 34.8* 35.1* 35.7* 36.2  MCV 85.7 83.2 83.8 84.4  PLT 155 163 178 200   Cardiac Enzymes: No results for input(s): CKTOTAL, CKMB, CKMBINDEX, TROPONINI in the last 168 hours. BNP: Invalid input(s):  POCBNP CBG: No results for input(s): GLUCAP in the last 168 hours. D-Dimer No results for input(s): DDIMER in the last 72 hours. Hgb A1c No results for input(s): HGBA1C in the last 72 hours. Lipid Profile No results for input(s): CHOL,  HDL, LDLCALC, TRIG, CHOLHDL, LDLDIRECT in the last 72 hours. Thyroid function studies No results for input(s): TSH, T4TOTAL, T3FREE, THYROIDAB in the last 72 hours.  Invalid input(s): FREET3 Anemia work up No results for input(s): VITAMINB12, FOLATE, FERRITIN, TIBC, IRON, RETICCTPCT in the last 72 hours. Urinalysis    Component Value Date/Time   COLORURINE YELLOW 05/04/2016 1215   APPEARANCEUR CLEAR 05/04/2016 1215   LABSPEC 1.013 05/04/2016 1215   PHURINE 5.5 05/04/2016 1215   GLUCOSEU NEGATIVE 05/04/2016 1215   HGBUR NEGATIVE 05/04/2016 1215   BILIRUBINUR NEGATIVE 05/04/2016 1215   KETONESUR NEGATIVE 05/04/2016 1215   PROTEINUR NEGATIVE 05/04/2016 1215   UROBILINOGEN 1.0 11/20/2008 1644   NITRITE NEGATIVE 05/04/2016 1215   LEUKOCYTESUR NEGATIVE 05/04/2016 1215   Sepsis Labs Invalid input(s): PROCALCITONIN,  WBC,  LACTICIDVEN Microbiology Recent Results (from the past 240 hour(s))  SARS Coronavirus 2 Ag (30 min TAT) - Nasal Swab (BD Veritor Kit)     Status: None   Collection Time: 10/19/19  7:58 AM   Specimen: Nasal Swab (BD Veritor Kit)  Result Value Ref Range Status   SARS Coronavirus 2 Ag NEGATIVE NEGATIVE Final    Comment: (NOTE) SARS-CoV-2 antigen NOT DETECTED.  Negative results are presumptive.  Negative results do not preclude SARS-CoV-2 infection and should not be used as the sole basis for treatment or other patient management decisions, including infection  control decisions, particularly in the presence of clinical signs and  symptoms consistent with COVID-19, or in those who have been in contact with the virus.  Negative results must be combined with clinical observations, patient history, and epidemiological information.  The expected result is Negative. Fact Sheet for Patients: PodPark.tn Fact Sheet for Healthcare Providers: GiftContent.is This test is not yet approved or cleared by the [place] FDA and  has been authorized for detection and/or diagnosis of SARS-CoV-2 by FDA under an Emergency Use Authorization (EUA).  This EUA will remain in effect (meaning this test can be used) for the duration of  the COVID-19 de claration under Section 564(b)(1) of the Act, 21 U.S.C. section 360bbb-3(b)(1), unless the authorization is terminated or revoked sooner. Performed at [facility], [address] [place], [place] [address]   SARS CORONAVIRUS 2 (TAT 6-24 HRS) Nasopharyngeal Nasopharyngeal Swab     Status: None   Collection Time: 10/19/19  9:54 AM   Specimen: Nasopharyngeal Swab  Result Value Ref Range Status   SARS Coronavirus 2 NEGATIVE NEGATIVE Final    Comment: (NOTE) SARS-CoV-2 target nucleic acids are NOT DETECTED. The SARS-CoV-2 RNA is generally detectable in upper and lower respiratory specimens during the acute phase of infection. Negative results do not preclude SARS-CoV-2 infection, do not rule out co-infections with other pathogens, and should not be used as the sole basis for treatment or other patient management decisions. Negative results must be combined with clinical observations, patient history, and epidemiological information. The expected result is Negative. Fact Sheet for Patients: SugarRoll.be Fact Sheet for Healthcare Providers: https://www.woods-mathews.com/ This test is not yet approved or cleared by the [place] FDA and  has been authorized for detection and/or diagnosis of SARS-CoV-2 by FDA under an Emergency Use Authorization (EUA). This EUA will remain  in effect (meaning this test can be used) for the duration of the COVID-19 declaration under Section  56 4(b)(1) of the Act, 21 U.S.C. section 360bbb-3(b)(1), unless the authorization is terminated or revoked sooner. Performed at [facility], [place] [address], [place], [place] [address]  Time coordinating discharge: Over 30 minutes  SIGNED:   Truett Mainland, DO [facility] 10/22/2019, 12:21 PM

## 2019-11-02 DIAGNOSIS — I5032 Chronic diastolic (congestive) heart failure: Secondary | ICD-10-CM | POA: Insufficient documentation

## 2019-11-02 NOTE — Progress Notes (Signed)
Follow up visit  Subjective:   [name] [name], female    DOB: 18-Jan-1951, 68 y.o.   MRN: [id number]   HPI  68 y.o.Caucasianfemalewith hypertension, hyperlipidemia, history of stroke,HFpEF, abnormal stress test (10/21/2019), COPD  Patient was admitted with earlier in 10/2019 with chest pain or shortness of breath. Presentation was thought to be that of hypertensive urgency and acute on chronic HFpEF. However, given mildly elevated HS trop, she underwent nuclear stress test that showed  Moderate-sized fixed defect mid ventricle to apex with questionable region of peri-infarct ischemia at the anterior septal margin.  Patient was treated medically with diuresis. She was started on losartan given uncontrolled blood pressure and acute on chronic HFpEF. Since discharge, she has not had any recurrent chest pain or shortness of breath symptoms. She is compliant with low salt diet and medications.    Current Outpatient Medications on File Prior to Visit  Medication Sig Dispense Refill  . albuterol (PROVENTIL HFA;VENTOLIN HFA) 108 (90 BASE) MCG/ACT inhaler Inhale 2 puffs into the lungs every 6 (six) hours as needed for wheezing.    Marland Kitchen ALPRAZolam (XANAX) 0.5 MG tablet Take 0.5 mg by mouth at bedtime as needed for sleep.     Marland Kitchen atorvastatin (LIPITOR) 20 MG tablet Take 20 mg by mouth daily.    . cetirizine (ZYRTEC) 10 MG tablet Take 10 mg by mouth daily as needed for allergies.     Marland Kitchen clopidogrel (PLAVIX) 75 MG tablet Take 75 mg by mouth daily.    . cyanocobalamin (,VITAMIN B-12,) 1000 MCG/ML injection Inject 1 mL into the muscle every 30 (thirty) days.    Marland Kitchen EPIPEN 2-PAK 0.3 MG/0.3ML SOAJ injection Inject 0.3 mg into the muscle as needed for anaphylaxis.     . fluticasone furoate-vilanterol (BREO ELLIPTA) 100-25 MCG/INH AEPB Inhale 1 puff into the lungs daily.    Marland Kitchen gabapentin (NEURONTIN) 300 MG capsule Take 300-600 mg by mouth 2 (two) times daily. Take 344m in the morning and noon; then take 606mat  bedtime    . isosorbide mononitrate (IMDUR) 30 MG 24 hr tablet Take 60 mg by mouth daily.     . Marland Kitchenabetalol (NORMODYNE) 100 MG tablet Take 250 mg by mouth 2 (two) times daily. Dose reduced per husband    . levothyroxine (SYNTHROID) 50 MCG tablet Take 50 mcg by mouth daily before breakfast.    . losartan (COZAAR) 50 MG tablet Take 1 tablet (50 mg total) by mouth daily. 30 tablet 0  . minoxidil (LONITEN) 10 MG tablet Take 10 mg by mouth daily.     . nitroGLYCERIN (NITROSTAT) 0.4 MG SL tablet Place 0.4 mg under the tongue every 5 (five) minutes as needed for chest pain.    . Marland Kitchenndansetron (ZOFRAN-ODT) 4 MG disintegrating tablet Take 4 mg by mouth every 8 (eight) hours as needed for nausea.    . pantoprazole (PROTONIX) 40 MG tablet Take 40 mg by mouth daily.    . primidone (MYSOLINE) 50 MG tablet Take 50 mg by mouth daily.    . Marland KitchenROCTOZONE-HC 2.5 % rectal cream Place 1 application rectally 2 (two) times daily as needed for hemorrhoids.    . sertraline (ZOLOFT) 100 MG tablet Take 1 tablet (100 mg total) by mouth daily. (Patient taking differently: Take 150 mg by mouth daily. ) 30 tablet 1  . topiramate (TOPAMAX) 50 MG tablet Take 50 mg by mouth 2 (two) times daily.    . Marland Kitchenorsemide (DEMADEX) 20 MG tablet Take 1 tablet (20 mg  total) by mouth daily. Take extra dose in the evening if weight gain more than 3 pounds or short of breath 30 tablet 0  . traMADol (ULTRAM) 50 MG tablet Take 50 mg by mouth every 6 (six) hours as needed for moderate pain.     . Vitamin D, Ergocalciferol, (DRISDOL) 1.25 MG (50000 UT) CAPS capsule Take 50,000 Units by mouth once a week.    . rivastigmine (EXELON) 4.5 MG capsule Take 4.5 mg by mouth 2 (two) times daily.     No current facility-administered medications on file prior to visit.    Cardiovascular & other pertient studies:  EKG 11/04/2019: Sinus rhythm 55 bpm. RIght axis deviation. Low voltage. Baseline artifact.   Nuclear stress test 10/20/2019: 1. Moderate-sized  fixed defect mid ventricle to apex with questionable region of peri-infarct ischemia at the anterior septal margin. 2. Normal left ventricular wall motion. 3. Left ventricular ejection fraction 54% 4. Non invasive risk stratification*: Low  Recent labs: 10/22/2019: Glucose 98, BUN/Cr 12/1.08. EGFR 53. Na/K 140/3.7. Rest of the CMP normal H/H 11.8/36.2. MCV 84. Platelets 200  Results for [name], [name] (MRN [id number]) as of 11/04/2019 13:45  Ref. Range 10/19/2019 09:54 10/19/2019 15:34 10/19/2019 17:08 10/20/2019 06:12  Troponin I (High Sensitivity) Latest Ref Range: <18 ng/L 33 (H) 55 (H) 65 (H) 33 (H)   Results for [name], [name] (MRN [id number]) as of 11/04/2019 13:45  Ref. Range 10/19/2019 07:58  B Natriuretic Peptide Latest Ref Range: 0.0 - 100.0 pg/mL 552.4 (H)     Review of Systems  Constitution: Negative for decreased appetite, malaise/fatigue, weight gain and weight loss.  HENT: Negative for congestion.   Eyes: Negative for visual disturbance.  Cardiovascular: Negative for chest pain, dyspnea on exertion, leg swelling, palpitations and syncope.  Respiratory: Negative for cough and shortness of breath.   Endocrine: Negative for cold intolerance.  Hematologic/Lymphatic: Does not bruise/bleed easily.  Skin: Negative for itching and rash.  Musculoskeletal: Negative for myalgias.  Gastrointestinal: Negative for abdominal pain, nausea and vomiting.  Genitourinary: Negative for dysuria.  Neurological: Negative for dizziness and weakness.  Psychiatric/Behavioral: The patient is not nervous/anxious.   All other systems reviewed and are negative.        Vitals:   11/04/19 1332  BP: (!) 115/50  Pulse: (!) 56  SpO2: 94%     Body mass index is 32.19 kg/m. Filed Weights   11/04/19 1332  Weight: 176 lb (79.8 kg)     Objective:   Physical Exam  Constitutional: She is oriented to person, place, and time. She appears well-developed and well-nourished. No distress.    HENT:  Head: Normocephalic and atraumatic.  Eyes: Pupils are equal, round, and reactive to light. Conjunctivae are normal.  Neck: No JVD present.  Cardiovascular: Normal rate, regular rhythm and intact distal pulses.  No murmur heard. Pulmonary/Chest: Effort normal and breath sounds normal. She has no wheezes. She has no rales.  Abdominal: Soft. Bowel sounds are normal. There is no rebound.  Musculoskeletal:        General: No edema.  Lymphadenopathy:    She has no cervical adenopathy.  Neurological: She is alert and oriented to person, place, and time. No cranial nerve deficit.  Skin: Skin is warm and dry.  Psychiatric: She has a normal mood and affect.  Nursing note and vitals reviewed.         Assessment & Recommendations:   68 y.o.Caucasianfemalewith hypertension, hyperlipidemia, history of stroke,HFpEF, abnormal stress test (10/21/2019), COPD  HFpEF: Chronic. Acute exacerbation has resolved. Continue blood pressure control. Take torsemide 20 mg in am, and additional dose in pm, in case of leg swelling or weight gain.  Hypertension: Controlled. Has regular follow up with PCP. May be to able to come off minoxidil in future.   Chest pain, abnormal stress test (10/2019): Elevation of high-sensitivity troponin in recent hospital admission most likely due to hypertensive urgency and acute on chronic HFpEF, rather than ACS. Minimal per-infarct ischemia on stress test. In absence of chest pain symptoms, continue medical treatment at this time. Continue Aspirin, statin, beta blocker., Will check lipid panel.  F/u in 3 months.   Nigel Mormon, MD [facility] Pager: [phone] Office: [phone]

## 2019-11-04 ENCOUNTER — Other Ambulatory Visit: Payer: Self-pay

## 2019-11-04 ENCOUNTER — Ambulatory Visit: Payer: Medicare Other | Admitting: Cardiology

## 2019-11-04 ENCOUNTER — Encounter: Payer: Self-pay | Admitting: Cardiology

## 2019-11-04 VITALS — BP 115/50 | HR 56 | Ht 62.0 in | Wt 176.0 lb

## 2019-11-04 DIAGNOSIS — E782 Mixed hyperlipidemia: Secondary | ICD-10-CM | POA: Diagnosis not present

## 2019-11-04 DIAGNOSIS — R9439 Abnormal result of other cardiovascular function study: Secondary | ICD-10-CM | POA: Diagnosis not present

## 2019-11-04 DIAGNOSIS — I5032 Chronic diastolic (congestive) heart failure: Secondary | ICD-10-CM | POA: Diagnosis not present

## 2019-11-04 DIAGNOSIS — I10 Essential (primary) hypertension: Secondary | ICD-10-CM

## 2019-11-06 ENCOUNTER — Other Ambulatory Visit: Payer: Self-pay | Admitting: Cardiology

## 2019-11-07 LAB — LIPID PANEL W/O CHOL/HDL RATIO
Cholesterol, Total: 131 mg/dL (ref 100–199)
HDL: 40 mg/dL (ref >39)
LDL Chol Calc (NIH): 69 mg/dL (ref 0–99)
Triglycerides: 122 mg/dL (ref 0–149)
VLDL Cholesterol Cal: 22 mg/dL (ref 5–40)

## 2019-11-07 LAB — SPECIMEN STATUS REPORT

## 2019-11-11 NOTE — Progress Notes (Signed)
Your patient 

## 2019-11-12 NOTE — Progress Notes (Signed)
Patient aware.

## 2019-11-16 ENCOUNTER — Other Ambulatory Visit: Payer: Self-pay | Admitting: Family Medicine

## 2020-02-03 ENCOUNTER — Ambulatory Visit: Payer: Medicare Other | Admitting: Cardiology

## 2020-02-03 NOTE — Progress Notes (Deleted)
Follow up visit  Subjective:   [name] [name], female    DOB: 10/16/1951, 69 y.o.   MRN: [id number]   HPI  69 y.o.Caucasianfemalewith hypertension, hyperlipidemia, history of stroke,HFpEF, abnormal stress test (10/21/2019), COPD  Patient was admitted with earlier in 10/2019 with chest pain or shortness of breath. Presentation was thought to be that of hypertensive urgency and acute on chronic HFpEF. However, given mildly elevated HS trop, she underwent nuclear stress test that showed  Moderate-sized fixed defect mid ventricle to apex with questionable region of peri-infarct ischemia at the anterior septal margin.  Patient was treated medically with diuresis. She was started on losartan given uncontrolled blood pressure and acute on chronic HFpEF. Since discharge, she has not had any recurrent chest pain or shortness of breath symptoms. She is compliant with low salt diet and medications.    Current Outpatient Medications on File Prior to Visit  Medication Sig Dispense Refill  . albuterol (PROVENTIL HFA;VENTOLIN HFA) 108 (90 BASE) MCG/ACT inhaler Inhale 2 puffs into the lungs every 6 (six) hours as needed for wheezing.    Marland Kitchen ALPRAZolam (XANAX) 0.5 MG tablet Take 0.5 mg by mouth at bedtime as needed for sleep.     Marland Kitchen atorvastatin (LIPITOR) 20 MG tablet Take 20 mg by mouth daily.    . cetirizine (ZYRTEC) 10 MG tablet Take 10 mg by mouth daily as needed for allergies.     Marland Kitchen clopidogrel (PLAVIX) 75 MG tablet Take 75 mg by mouth daily.    . cyanocobalamin (,VITAMIN B-12,) 1000 MCG/ML injection Inject 1 mL into the muscle every 30 (thirty) days.    Marland Kitchen EPIPEN 2-PAK 0.3 MG/0.3ML SOAJ injection Inject 0.3 mg into the muscle as needed for anaphylaxis.     . fluticasone furoate-vilanterol (BREO ELLIPTA) 100-25 MCG/INH AEPB Inhale 1 puff into the lungs daily.    Marland Kitchen gabapentin (NEURONTIN) 300 MG capsule Take 300-600 mg by mouth 2 (two) times daily. Take 369m in the morning and noon; then take 6057mat  bedtime    . isosorbide mononitrate (IMDUR) 30 MG 24 hr tablet Take 60 mg by mouth daily.     . Marland Kitchenabetalol (NORMODYNE) 100 MG tablet Take 250 mg by mouth 2 (two) times daily. Dose reduced per husband    . levothyroxine (SYNTHROID) 50 MCG tablet Take 50 mcg by mouth daily before breakfast.    . losartan (COZAAR) 50 MG tablet Take 1 tablet (50 mg total) by mouth daily. 30 tablet 0  . minoxidil (LONITEN) 10 MG tablet Take 10 mg by mouth daily.     . nitroGLYCERIN (NITROSTAT) 0.4 MG SL tablet Place 0.4 mg under the tongue every 5 (five) minutes as needed for chest pain.    . Marland Kitchenndansetron (ZOFRAN-ODT) 4 MG disintegrating tablet Take 4 mg by mouth every 8 (eight) hours as needed for nausea.    . pantoprazole (PROTONIX) 40 MG tablet Take 40 mg by mouth daily.    . primidone (MYSOLINE) 50 MG tablet Take 50 mg by mouth daily.    . Marland KitchenROCTOZONE-HC 2.5 % rectal cream Place 1 application rectally 2 (two) times daily as needed for hemorrhoids.    . rivastigmine (EXELON) 4.5 MG capsule Take 4.5 mg by mouth 2 (two) times daily.    . sertraline (ZOLOFT) 100 MG tablet Take 1 tablet (100 mg total) by mouth daily. (Patient taking differently: Take 150 mg by mouth daily. ) 30 tablet 1  . topiramate (TOPAMAX) 50 MG tablet Take 50 mg by mouth  2 (two) times daily.    Marland Kitchen torsemide (DEMADEX) 20 MG tablet Take 1 tablet (20 mg total) by mouth daily. Take extra dose in the evening if weight gain more than 3 pounds or short of breath 30 tablet 0  . traMADol (ULTRAM) 50 MG tablet Take 50 mg by mouth every 6 (six) hours as needed for moderate pain.     . Vitamin D, Ergocalciferol, (DRISDOL) 1.25 MG (50000 UT) CAPS capsule Take 50,000 Units by mouth once a week.     No current facility-administered medications on file prior to visit.    Cardiovascular & other pertient studies:  EKG 11/04/2019: Sinus rhythm 55 bpm. RIght axis deviation. Low voltage. Baseline artifact.   Nuclear stress test 10/20/2019: 1. Moderate-sized  fixed defect mid ventricle to apex with questionable region of peri-infarct ischemia at the anterior septal margin. 2. Normal left ventricular wall motion. 3. Left ventricular ejection fraction 54% 4. Non invasive risk stratification*: Low  Recent labs: 10/22/2019: Glucose 98, BUN/Cr 12/1.08. EGFR 53. Na/K 140/3.7. Rest of the CMP normal H/H 11.8/36.2. MCV 84. Platelets 200  Results for [name] [name] (MRN [id number]) as of 11/04/2019 13:45  Ref. Range 10/19/2019 09:54 10/19/2019 15:34 10/19/2019 17:08 10/20/2019 06:12  Troponin I (High Sensitivity) Latest Ref Range: <18 ng/L 33 (H) 55 (H) 65 (H) 33 (H)   Results for [name] [name] (MRN [id number]) as of 11/04/2019 13:45  Ref. Range 10/19/2019 07:58  B Natriuretic Peptide Latest Ref Range: 0.0 - 100.0 pg/mL 552.4 (H)     Review of Systems  Cardiovascular: Negative for chest pain, dyspnea on exertion, leg swelling, palpitations and syncope.        *** There were no vitals filed for this visit.  *** There is no height or weight on file to calculate BMI. There were no vitals filed for this visit.   Objective:   Physical Exam  Constitutional: She is oriented to person, place, and time. She appears well-developed and well-nourished. No distress.  HENT:  Head: Normocephalic and atraumatic.  Eyes: Pupils are equal, round, and reactive to light. Conjunctivae are normal.  Neck: No JVD present.  Cardiovascular: Normal rate, regular rhythm and intact distal pulses.  No murmur heard. Pulmonary/Chest: Effort normal and breath sounds normal. She has no wheezes. She has no rales.  Abdominal: Soft. Bowel sounds are normal. There is no rebound.  Musculoskeletal:        General: No edema.  Lymphadenopathy:    She has no cervical adenopathy.  Neurological: She is alert and oriented to person, place, and time. No cranial nerve deficit.  Skin: Skin is warm and dry.  Psychiatric: She has a normal mood and affect.  Nursing note and  vitals reviewed.         Assessment & Recommendations:   69 y.o.Caucasianfemalewith hypertension, hyperlipidemia, history of stroke,HFpEF, abnormal stress test (10/21/2019), COPD  HFpEF: Chronic. Acute exacerbation has resolved. Continue blood pressure control. Take torsemide 20 mg in am, and additional dose in pm, in case of leg swelling or weight gain.  Hypertension: Controlled. Has regular follow up with PCP. May be to able to come off minoxidil in future.   Chest pain, abnormal stress test (10/2019): Elevation of high-sensitivity troponin in recent hospital admission most likely due to hypertensive urgency and acute on chronic HFpEF, rather than ACS. Minimal per-infarct ischemia on stress test. In absence of chest pain symptoms, continue medical treatment at this time. Continue Aspirin, statin, beta blocker., Will check lipid panel.  F/u in 3 months.   Nigel Mormon, MD [facility] Pager: [phone] Office: [phone]

## 2020-02-28 NOTE — Progress Notes (Deleted)
Follow up visit  Subjective:   [name] [name], female    DOB: 1951/07/30, 69 y.o.   MRN: [id number]    *** HPI  No chief complaint on file.   69 y.o. Caucasian female with hypertension, hyperlipidemia, history of stroke,HFpEF, abnormal stress test (10/21/2019), COPD.  The patient is here today for her 3 month follow up visit.    ***Patient was admitted with earlier in 10/2019 with chest pain or shortness of breath. Presentation was thought to be that of hypertensive urgency and acute on chronic HFpEF. However, given mildly elevated HS trop, she underwent nuclear stress test that showed  Moderate-sized fixed defect mid ventricle to apex with questionable region of peri-infarct ischemia at the anterior septal margin.  Patient was treated medically with diuresis. She was started on losartan given uncontrolled blood pressure and acute on chronic HFpEF. Since discharge, she has not had any recurrent chest pain or shortness of breath symptoms. She is compliant with low salt diet and medications.   ***  *** Current Outpatient Medications on File Prior to Visit  Medication Sig Dispense Refill  . albuterol (PROVENTIL HFA;VENTOLIN HFA) 108 (90 BASE) MCG/ACT inhaler Inhale 2 puffs into the lungs every 6 (six) hours as needed for wheezing.    Marland Kitchen ALPRAZolam (XANAX) 0.5 MG tablet Take 0.5 mg by mouth at bedtime as needed for sleep.     Marland Kitchen atorvastatin (LIPITOR) 20 MG tablet Take 20 mg by mouth daily.    . cetirizine (ZYRTEC) 10 MG tablet Take 10 mg by mouth daily as needed for allergies.     Marland Kitchen clopidogrel (PLAVIX) 75 MG tablet Take 75 mg by mouth daily.    . cyanocobalamin (,VITAMIN B-12,) 1000 MCG/ML injection Inject 1 mL into the muscle every 30 (thirty) days.    Marland Kitchen EPIPEN 2-PAK 0.3 MG/0.3ML SOAJ injection Inject 0.3 mg into the muscle as needed for anaphylaxis.     . fluticasone furoate-vilanterol (BREO ELLIPTA) 100-25 MCG/INH AEPB Inhale 1 puff into the lungs daily.    Marland Kitchen gabapentin  (NEURONTIN) 300 MG capsule Take 300-600 mg by mouth 2 (two) times daily. Take 317m in the morning and noon; then take 6037mat bedtime    . isosorbide mononitrate (IMDUR) 30 MG 24 hr tablet Take 60 mg by mouth daily.     . Marland Kitchenabetalol (NORMODYNE) 100 MG tablet Take 250 mg by mouth 2 (two) times daily. Dose reduced per husband    . levothyroxine (SYNTHROID) 50 MCG tablet Take 50 mcg by mouth daily before breakfast.    . losartan (COZAAR) 50 MG tablet Take 1 tablet (50 mg total) by mouth daily. 30 tablet 0  . minoxidil (LONITEN) 10 MG tablet Take 10 mg by mouth daily.     . nitroGLYCERIN (NITROSTAT) 0.4 MG SL tablet Place 0.4 mg under the tongue every 5 (five) minutes as needed for chest pain.    . Marland Kitchenndansetron (ZOFRAN-ODT) 4 MG disintegrating tablet Take 4 mg by mouth every 8 (eight) hours as needed for nausea.    . pantoprazole (PROTONIX) 40 MG tablet Take 40 mg by mouth daily.    . primidone (MYSOLINE) 50 MG tablet Take 50 mg by mouth daily.    . Marland KitchenROCTOZONE-HC 2.5 % rectal cream Place 1 application rectally 2 (two) times daily as needed for hemorrhoids.    . rivastigmine (EXELON) 4.5 MG capsule Take 4.5 mg by mouth 2 (two) times daily.    . sertraline (ZOLOFT) 100 MG tablet Take 1 tablet (100 mg  total) by mouth daily. (Patient taking differently: Take 150 mg by mouth daily. ) 30 tablet 1  . topiramate (TOPAMAX) 50 MG tablet Take 50 mg by mouth 2 (two) times daily.    Marland Kitchen torsemide (DEMADEX) 20 MG tablet Take 1 tablet (20 mg total) by mouth daily. Take extra dose in the evening if weight gain more than 3 pounds or short of breath 30 tablet 0  . traMADol (ULTRAM) 50 MG tablet Take 50 mg by mouth every 6 (six) hours as needed for moderate pain.     . Vitamin D, Ergocalciferol, (DRISDOL) 1.25 MG (50000 UT) CAPS capsule Take 50,000 Units by mouth once a week.     No current facility-administered medications on file prior to visit.    Cardiovascular & other pertient studies:  *** EKG  11/04/2019: Sinus rhythm 55 bpm. RIght axis deviation. Low voltage. Baseline artifact.  Echocardiogram 10/19/2019: 1. Left ventricular ejection fraction, by visual estimation, is 55 to 60%. The left ventricle has normal function. There is mildly increased left ventricular hypertrophy.  2. Left ventricular diastolic parameters are consistent with Grade I diastolic dysfunction (impaired relaxation).  3. The left ventricle has no regional wall motion abnormalities.  4. Global right ventricle has normal systolic function.The right ventricular size is normal. No increase in right ventricular wall thickness.  5. Left atrial size was mildly dilated.  6. Right atrial size was normal.  7. Moderate pericardial effusion.  8. Presence of pericardial fat pad.  9. The pericardial effusion is circumferential.  10. Mild mitral annular calcification.  11. The mitral valve is degenerative. No evidence of mitral valve regurgitation.  12. The tricuspid valve is grossly normal. Tricuspid valve regurgitation is trivial.  13. The aortic valve is tricuspid. Aortic valve regurgitation is not visualized. No evidence of aortic valve sclerosis or stenosis.  14. The pulmonic valve was grossly normal. Pulmonic valve regurgitation is not visualized.  15. TR signal is inadequate for assessing pulmonary artery systolic pressure.  Nuclear stress test 10/20/2019: 1. Moderate-sized fixed defect mid ventricle to apex with questionable region of peri-infarct ischemia at the anterior septalmargin. 2. Normal left ventricular wall motion. 3. Left ventricular ejection fraction 54% 4. Non invasive risk stratification*: Low  EKG ***/***/202***: ***  ***Recent labs: 11/06/2019: Chol 131, TG 122, HDL 40, LDL 69.  10/22/2019: Glucose 98, BUN/Cr 12/1.08. EGFR 53. Na/K 140/3.7. Rest of the CMP normal H/H 11.8/36.2. MCV 84. Platelets 200  10/19/2020:  Troponin I 33.  10/18/2020: BNP 552.4, Troponin I 65.   *** Review  of Systems  Cardiovascular: Negative for chest pain, dyspnea on exertion, leg swelling, palpitations and syncope.        *** There were no vitals filed for this visit.  There is no height or weight on file to calculate BMI. There were no vitals filed for this visit.  *** Objective:   Physical Exam  Constitutional: No distress.  Neck: No JVD present.  Cardiovascular: Normal rate, regular rhythm, normal heart sounds and intact distal pulses.  No murmur heard. Pulmonary/Chest: Effort normal and breath sounds normal. She has no wheezes. She has no rales.  Musculoskeletal:        General: No edema.  Nursing note and vitals reviewed.         Assessment & Recommendations:    ***69 y.o.Caucasianfemalewith hypertension, hyperlipidemia, history of stroke,HFpEF, abnormal stress test (10/21/2019), COPD  HFpEF: Chronic. Acute exacerbation has resolved. Continue blood pressure control. Take torsemide 20 mg in am, and additional dose  in pm, in case of leg swelling or weight gain.  Hypertension: Controlled. Has regular follow up with PCP. May be to able to come off minoxidil in future.   Chest pain, abnormal stress test (10/2019): Elevation of high-sensitivity troponin in recent hospital admission most likely due to hypertensive urgency and acute on chronic HFpEF, rather than ACS. Minimal per-infarct ischemia on stress test. In absence of chest pain symptoms, continue medical treatment at this time. Continue Aspirin, statin, beta blocker., Will check lipid panel.  F/u in 3 months.   Nigel Mormon, MD [facility] Pager: [phone] Office: [phone]

## 2020-03-02 ENCOUNTER — Ambulatory Visit: Payer: Medicare Other | Admitting: Cardiology

## 2020-03-18 NOTE — Progress Notes (Signed)
Follow up visit  Subjective:   [name] [name], female    DOB: 1951-06-18, 69 y.o.   MRN: [id number]   HPI   Chief Complaint  Patient presents with  . Congestive Heart Failure  . Follow-up    3 month    69 y.o. Caucasian female with hypertension, hyperlipidemia, history of stroke,HFpEF, abnormal stress test (10/21/2019), COPD.   Patient was admitted to [facility] in 10/2019 with chest pain or shortness of breath. Presentation was thought to be that of hypertensive urgency and acute on chronic HFpEF. However, given mildly elevated HS trop, she underwent nuclear stress test that showed  Moderate-sized fixed defect mid ventricle to apex with questionable region of peri-infarct ischemia at the anterior septal margin. Inn absence of recurrent chest pain, patient was treated medically with diuresis. She was started on losartan given uncontrolled blood pressure and acute on chronic HFpEF. Since discharge, she has not had any recurrent chest pain or shortness of breath symptoms. She is compliant with low salt diet and medications.   Patient has had only occasional episode of chest pain requiring nitroglycerin, roughly once a month.  She has episodes of exertional dyspnea with more than usual physical activity.  She and husband admit that her walking is limited at baseline.  She complains of left arm and leg numbness as well as difficulty grasping objects with her left hand.  On further questioning, she endorses pain and stiffness in her fingers, worse in the morning.  Blood pressure elevated today.  At home, blood pressure ranges anywhere from 100/50 mmHg to 150/80 mmHg.  Recently, her PCP increased her labetalol dose.  Current Outpatient Medications on File Prior to Visit  Medication Sig Dispense Refill  . albuterol (PROVENTIL HFA;VENTOLIN HFA) 108 (90 BASE) MCG/ACT inhaler Inhale 2 puffs into the lungs every 6 (six) hours as needed for wheezing.    Marland Kitchen ALPRAZolam (XANAX) 0.5 MG tablet  Take 0.5 mg by mouth at bedtime as needed for sleep.     Marland Kitchen atorvastatin (LIPITOR) 20 MG tablet Take 20 mg by mouth daily.    . cetirizine (ZYRTEC) 10 MG tablet Take 10 mg by mouth daily as needed for allergies.     Marland Kitchen clopidogrel (PLAVIX) 75 MG tablet Take 75 mg by mouth daily.    . cyanocobalamin (,VITAMIN B-12,) 1000 MCG/ML injection Inject 1 mL into the muscle every 30 (thirty) days.    Marland Kitchen EPIPEN 2-PAK 0.3 MG/0.3ML SOAJ injection Inject 0.3 mg into the muscle as needed for anaphylaxis.     . fluticasone furoate-vilanterol (BREO ELLIPTA) 100-25 MCG/INH AEPB Inhale 1 puff into the lungs daily.    Marland Kitchen gabapentin (NEURONTIN) 300 MG capsule Take 300-600 mg by mouth 2 (two) times daily. Take 316m in the morning and noon; then take 6066mat bedtime    . isosorbide mononitrate (IMDUR) 30 MG 24 hr tablet Take 60 mg by mouth daily.     . Marland Kitchenabetalol (NORMODYNE) 100 MG tablet Take 250 mg by mouth 2 (two) times daily. Dose reduced per husband    . levothyroxine (SYNTHROID) 50 MCG tablet Take 50 mcg by mouth daily before breakfast.    . losartan (COZAAR) 50 MG tablet Take 1 tablet (50 mg total) by mouth daily. 30 tablet 0  . nitroGLYCERIN (NITROSTAT) 0.4 MG SL tablet Place 0.4 mg under the tongue every 5 (five) minutes as needed for chest pain.    . Marland Kitchenndansetron (ZOFRAN-ODT) 4 MG disintegrating tablet Take 4 mg by mouth every  8 (eight) hours as needed for nausea.    . pantoprazole (PROTONIX) 40 MG tablet Take 40 mg by mouth daily.    . primidone (MYSOLINE) 50 MG tablet Take 50 mg by mouth daily.    Marland Kitchen PROCTOZONE-HC 2.5 % rectal cream Place 1 application rectally 2 (two) times daily as needed for hemorrhoids.    . sertraline (ZOLOFT) 100 MG tablet Take 1 tablet (100 mg total) by mouth daily. (Patient taking differently: Take 150 mg by mouth daily. ) 30 tablet 1  . topiramate (TOPAMAX) 50 MG tablet Take 50 mg by mouth 2 (two) times daily.    Marland Kitchen torsemide (DEMADEX) 20 MG tablet Take 1 tablet (20 mg total) by mouth  daily. Take extra dose in the evening if weight gain more than 3 pounds or short of breath 30 tablet 0  . traMADol (ULTRAM) 50 MG tablet Take 50 mg by mouth every 6 (six) hours as needed for moderate pain.     . Vitamin D, Ergocalciferol, (DRISDOL) 1.25 MG (50000 UT) CAPS capsule Take 50,000 Units by mouth once a week.     No current facility-administered medications on file prior to visit.    Cardiovascular & other pertient studies:  EKG 11/04/2019: Sinus rhythm 55 bpm. RIght axis deviation. Low voltage. Baseline artifact.  Echocardiogram 10/19/2019: 1. Left ventricular ejection fraction, by visual estimation, is 55 to 60%. The left ventricle has normal function. There is mildly increased left ventricular hypertrophy.  2. Left ventricular diastolic parameters are consistent with Grade I diastolic dysfunction (impaired relaxation).  3. The left ventricle has no regional wall motion abnormalities.  4. Global right ventricle has normal systolic function.The right ventricular size is normal. No increase in right ventricular wall thickness.  5. Left atrial size was mildly dilated.  6. Right atrial size was normal.  7. Moderate pericardial effusion.  8. Presence of pericardial fat pad.  9. The pericardial effusion is circumferential.  10. Mild mitral annular calcification.  11. The mitral valve is degenerative. No evidence of mitral valve regurgitation.  12. The tricuspid valve is grossly normal. Tricuspid valve regurgitation is trivial.  13. The aortic valve is tricuspid. Aortic valve regurgitation is not visualized. No evidence of aortic valve sclerosis or stenosis.  14. The pulmonic valve was grossly normal. Pulmonic valve regurgitation is not visualized.  15. TR signal is inadequate for assessing pulmonary artery systolic pressure.  Nuclear stress test 10/20/2019: 1. Moderate-sized fixed defect mid ventricle to apex with questionable region of peri-infarct ischemia at the anterior  septalmargin. 2. Normal left ventricular wall motion. 3. Left ventricular ejection fraction 54% 4. Non invasive risk stratification*: Low   Recent labs:  10/19/2020:  Troponin I 33.  10/18/2020: BNP 552.4, Troponin I 65.  11/06/2019: Chol 131, TG 122, HDL 40, LDL 69.  10/22/2019: Glucose 98, BUN/Cr 12/1.08. EGFR 53. Na/K 140/3.7. Rest of the CMP normal H/H 11.8/36.2. MCV 84. Platelets 200   Review of Systems  Cardiovascular: Positive for chest pain (Occasional) and dyspnea on exertion. Negative for leg swelling, palpitations and syncope.  Musculoskeletal: Positive for joint pain (Left hand).  Neurological: Positive for paresthesias (Left arm and left leg).         Vitals:   03/20/20 1044  BP: (!) 147/73  Pulse: (!) 53  Resp: 17  Temp: 97.9 F (36.6 C)  SpO2: 96%     Body mass index is 34.2 kg/m. Filed Weights   03/20/20 1044  Weight: 187 lb (84.8 kg)  Objective:   Physical Exam  Constitutional: No distress.  Neck: No JVD present.  Cardiovascular: Normal rate, regular rhythm, normal heart sounds and intact distal pulses.  No murmur heard. Pulmonary/Chest: Effort normal and breath sounds normal. She has no wheezes. She has no rales.  Musculoskeletal:        General: No edema.     Comments: Bony deformity left hand  Nursing note and vitals reviewed.         Assessment & Recommendations:    69 y.o. Caucasian female with hypertension, hyperlipidemia, history of stroke,HFpEF, abnormal stress test (10/21/2019), COPD.  HFpEF: Chronic.  Continue blood pressure control, low-salt diet, diuretic use.   Hypertension: Blood pressure elevated today.  Recommended blood pressure monitoring.  If blood pressure remains elevated, recommend increasing losartan dose.  Chest pain, abnormal stress test (10/2019): Occasional chest pain, controlled with nitroglycerin about once a month. Continue medical management for now.  Left hand pain: I suspect she  may have rheumatoid arthritis.  I have encouraged her to speak with her PCP regarding the same.  Follow-up in 3 months   Cyle Kenyon Esther Hardy, MD [facility] Pager: [phone] Office: [phone]

## 2020-03-20 ENCOUNTER — Encounter: Payer: Self-pay | Admitting: Cardiology

## 2020-03-20 ENCOUNTER — Other Ambulatory Visit: Payer: Self-pay

## 2020-03-20 ENCOUNTER — Ambulatory Visit: Payer: Medicare Other | Admitting: Cardiology

## 2020-03-20 VITALS — BP 147/73 | HR 53 | Temp 97.9°F | Resp 17 | Ht 62.0 in | Wt 187.0 lb

## 2020-03-20 DIAGNOSIS — R9439 Abnormal result of other cardiovascular function study: Secondary | ICD-10-CM

## 2020-03-20 DIAGNOSIS — I208 Other forms of angina pectoris: Secondary | ICD-10-CM

## 2020-03-20 DIAGNOSIS — I10 Essential (primary) hypertension: Secondary | ICD-10-CM

## 2020-03-20 DIAGNOSIS — M79642 Pain in left hand: Secondary | ICD-10-CM

## 2020-03-20 DIAGNOSIS — I5032 Chronic diastolic (congestive) heart failure: Secondary | ICD-10-CM

## 2020-03-20 DIAGNOSIS — E782 Mixed hyperlipidemia: Secondary | ICD-10-CM

## 2020-03-20 DIAGNOSIS — I2089 Other forms of angina pectoris: Secondary | ICD-10-CM

## 2020-05-16 ENCOUNTER — Emergency Department (HOSPITAL_BASED_OUTPATIENT_CLINIC_OR_DEPARTMENT_OTHER)
Admission: EM | Admit: 2020-05-16 | Discharge: 2020-05-16 | Disposition: A | Discharge status: Home / Self Care | Payer: Medicare Other | Attending: Emergency Medicine | Admitting: Emergency Medicine

## 2020-05-16 ENCOUNTER — Emergency Department (HOSPITAL_BASED_OUTPATIENT_CLINIC_OR_DEPARTMENT_OTHER): Payer: Medicare Other

## 2020-05-16 ENCOUNTER — Other Ambulatory Visit: Payer: Self-pay

## 2020-05-16 ENCOUNTER — Encounter (HOSPITAL_BASED_OUTPATIENT_CLINIC_OR_DEPARTMENT_OTHER): Payer: Self-pay | Admitting: Emergency Medicine

## 2020-05-16 DIAGNOSIS — M25532 Pain in left wrist: Secondary | ICD-10-CM | POA: Insufficient documentation

## 2020-05-16 DIAGNOSIS — M7918 Myalgia, other site: Secondary | ICD-10-CM | POA: Diagnosis not present

## 2020-05-16 DIAGNOSIS — S6992XA Unspecified injury of left wrist, hand and finger(s), initial encounter: Secondary | ICD-10-CM | POA: Insufficient documentation

## 2020-05-16 DIAGNOSIS — S0181XA Laceration without foreign body of other part of head, initial encounter: Secondary | ICD-10-CM

## 2020-05-16 DIAGNOSIS — I509 Heart failure, unspecified: Secondary | ICD-10-CM | POA: Diagnosis not present

## 2020-05-16 DIAGNOSIS — Z23 Encounter for immunization: Secondary | ICD-10-CM | POA: Insufficient documentation

## 2020-05-16 DIAGNOSIS — S0990XA Unspecified injury of head, initial encounter: Secondary | ICD-10-CM

## 2020-05-16 DIAGNOSIS — Y999 Unspecified external cause status: Secondary | ICD-10-CM | POA: Diagnosis not present

## 2020-05-16 DIAGNOSIS — S63042A Subluxation of carpometacarpal joint of left thumb, initial encounter: Secondary | ICD-10-CM | POA: Diagnosis not present

## 2020-05-16 DIAGNOSIS — W19XXXA Unspecified fall, initial encounter: Secondary | ICD-10-CM

## 2020-05-16 DIAGNOSIS — Z96652 Presence of left artificial knee joint: Secondary | ICD-10-CM | POA: Diagnosis not present

## 2020-05-16 DIAGNOSIS — Y929 Unspecified place or not applicable: Secondary | ICD-10-CM | POA: Insufficient documentation

## 2020-05-16 DIAGNOSIS — R519 Headache, unspecified: Secondary | ICD-10-CM | POA: Diagnosis not present

## 2020-05-16 DIAGNOSIS — W01198A Fall on same level from slipping, tripping and stumbling with subsequent striking against other object, initial encounter: Secondary | ICD-10-CM | POA: Diagnosis not present

## 2020-05-16 DIAGNOSIS — Y939 Activity, unspecified: Secondary | ICD-10-CM | POA: Diagnosis not present

## 2020-05-16 DIAGNOSIS — S01112A Laceration without foreign body of left eyelid and periocular area, initial encounter: Secondary | ICD-10-CM | POA: Diagnosis not present

## 2020-05-16 DIAGNOSIS — I110 Hypertensive heart disease with heart failure: Secondary | ICD-10-CM | POA: Diagnosis not present

## 2020-05-16 MED ORDER — TETANUS-DIPHTH-ACELL PERTUSSIS 5-2.5-18.5 LF-MCG/0.5 IM SUSP
0.5000 mL | Freq: Once | INTRAMUSCULAR | Status: AC
  Administered 2020-05-16: 0.5 mL via INTRAMUSCULAR
  Filled 2020-05-16: qty 0.5

## 2020-05-16 MED ORDER — ACETAMINOPHEN 325 MG PO TABS
650.0000 mg | ORAL_TABLET | Freq: Once | ORAL | Status: AC
  Administered 2020-05-16: 650 mg via ORAL
  Filled 2020-05-16: qty 2

## 2020-05-16 NOTE — Discharge Instructions (Signed)
Keep the wound clean and dry.  Do not apply any ointments or moisturizer to the area.  The glue will fall off in about 5 days on its own.   You can take extra Tylenol as needed for pain.  Take hot showers and hot baths and do some gentle stretching.  Wear the wrist splint at all times except for when washing hands and showering.  Follow-up with your PCP or orthopedist for reevaluation of your symptoms and repeat x-rays in about 7 to 14 days to rule out wrist fracture involving the scaphoid bone.  Return to the emergency department if any concerning signs or symptoms develop such as head injury, loss of consciousness, fevers, numbness or weakness, severe swelling or signs of infection.

## 2020-05-16 NOTE — ED Provider Notes (Signed)
[facility] EMERGENCY DEPARTMENT Provider Note   CSN: [id number] Arrival date & time: 05/16/20  1553     History Chief Complaint  Patient presents with  . Fall    [name] [name] is a 69 y.o. female with history of hypertension, hyperlipidemia, OSA, CVA presents for evaluation after fall just prior to arrival.  She reports that she was going to visit her daughter and when they stopped and sent her home her daughter's 60+ lb [place] terriers "were very excited to see [place] and came and jumped on [place]".  She states that she tripped over the dogs and landed on her left side.  She denies loss of consciousness and her husband at the bedside who witnessed the fall corroborates this.  She endorses mild left-sided headache along the temple, left wrist pain along the radial aspect of the wrist and mild left buttock pain.  She has been able to ambulate and bear weight since the fall.  She denies any prodrome leading up to the fall.  She has been compliant with her Plavix.  She denies numbness or weakness of the extremities, vision changes, nausea, vomiting, abdominal pain, chest pain or shortness of breath.  No medications for pain just prior to arrival.  She is unsure if her tetanus is up-to-date.  She is right-hand dominant.  The history is provided by the patient.       Past Medical History:  Diagnosis Date  . Angioedema   . Depression   . Edema   . HLD (hyperlipidemia)   . HTN (hypertension)   . Mild renal insufficiency   . Mini stroke (HCC)   . Osteopenia    -1.8 03/2010  . Pulmonary nodule   . Sleep apnea    has cpap machine x non compliant due to "panic". Managed by Dr. Jobe Marker n post viral reactive cough periodically since 2002  . Stroke (HCC) 2015  . Thyroid disease   . Tremor    of unclear etiology  . VAIN I (vaginal intraepithelial neoplasia grade I) 2009    Patient Active Problem List   Diagnosis Date Noted  . Stable angina (HCC) 03/20/2020  . Left hand pain  03/20/2020  . Abnormal stress test 11/04/2019  . Mixed hyperlipidemia 11/04/2019  . Chronic heart failure with preserved ejection fraction (HCC) 11/02/2019  . Precordial pain   . CHF exacerbation (HCC) 10/19/2019  . Acute exacerbation of CHF (congestive heart failure) (HCC) 10/19/2019  . Bradycardia 10/19/2019  . Hypothyroidism 07/30/2018  . Generalized anxiety disorder 04/11/2013  . Major depressive disorder, recurrent episode, severe (HCC) 04/11/2013  . HYPOKALEMIA 06/07/2010  . HYPERCHOLESTEROLEMIA-PURE 11/12/2008  . TREMOR 11/12/2008  . SHORTNESS OF BREATH (SOB) 12/25/2007  . Anxiety and depression 12/08/2007  . Essential hypertension 12/08/2007  . SLEEP APNEA 12/08/2007  . PULMONARY NODULE 12/04/2007  . COUGH 12/04/2007    Past Surgical History:  Procedure Laterality Date  . ABDOMINAL HYSTERECTOMY  2000   TAH  . bilateral tube ligation    . BROW LIFT    . HERNIA REPAIR    . hysterectomy other)    . left knee replacement    . post lumpectomy    . stamey procedure    . WRIST FRACTURE SURGERY  1/12     OB History    Gravida  1   Para  1   Term      Preterm      AB      Living  1  SAB      TAB      Ectopic      Multiple      Live Births           Obstetric Comments  I adopted child        Family History  Problem Relation Age of Onset  . COPD Father        17% capacity  . Heart attack Father        in his 6550s  . Diabetes Father   . Hypertension Father   . Hypertension Mother   . Diabetes Mother   . Heart attack Mother   . Alcohol abuse Maternal Grandfather   . Alcohol abuse Paternal Grandfather   . Seizures Neg Hx     Social History   Tobacco Use  . Smoking status: Never Smoker  . Smokeless tobacco: Never Used  . Tobacco comment: nonsmoker. Passive 1st 20 years of life   Vaping Use  . Vaping Use: Never used  Substance Use Topics  . Alcohol use: Yes    Alcohol/week: 0.5 standard drinks    Types: 1 Standard drinks or  equivalent per week    Comment: rare  . Drug use: No    Comment: Coffee  Caffeinated Beverages 24 ounces per day.    Home Medications Prior to Admission medications   Medication Sig Start Date End Date Taking? Authorizing Provider  albuterol (PROVENTIL HFA;VENTOLIN HFA) 108 (90 BASE) MCG/ACT inhaler Inhale 2 puffs into the lungs every 6 (six) hours as needed for wheezing.    [provider]  ALPRAZolam Prudy Feeler(XANAX) 0.5 MG tablet Take 0.5 mg by mouth at bedtime as needed for sleep.     [provider]  atorvastatin (LIPITOR) 20 MG tablet Take 20 mg by mouth daily.    [provider]  cetirizine (ZYRTEC) 10 MG tablet Take 10 mg by mouth daily as needed for allergies.     [provider]  clopidogrel (PLAVIX) 75 MG tablet Take 75 mg by mouth daily.    [provider]  cyanocobalamin (,VITAMIN B-12,) 1000 MCG/ML injection Inject 1 mL into the muscle every 30 (thirty) days. 10/10/19   [provider]  EPIPEN 2-PAK 0.3 MG/0.3ML SOAJ injection Inject 0.3 mg into the muscle as needed for anaphylaxis.  06/25/13   [provider]  fluticasone furoate-vilanterol (BREO ELLIPTA) 100-25 MCG/INH AEPB Inhale 1 puff into the lungs daily. 07/26/19   [provider]  gabapentin (NEURONTIN) 300 MG capsule Take 300-600 mg by mouth 2 (two) times daily. Take 300mg  in the morning and noon; then take 600mg  at bedtime    [provider]  isosorbide mononitrate (IMDUR) 30 MG 24 hr tablet Take 60 mg by mouth daily.  06/10/13     labetalol (NORMODYNE) 100 MG tablet Take 250 mg by mouth 2 (two) times daily. Dose reduced per husband    [provider]  levothyroxine (SYNTHROID) 50 MCG tablet Take 50 mcg by mouth daily before breakfast.    [provider]  losartan (COZAAR) 50 MG tablet Take 1 tablet (50 mg total) by mouth daily. 10/23/19   Levie HeritageStinson, Jacob J, DO  nitroGLYCERIN (NITROSTAT) 0.4 MG SL tablet Place 0.4 mg under the tongue  every 5 (five) minutes as needed for chest pain.    [provider]  ondansetron (ZOFRAN-ODT) 4 MG disintegrating tablet Take 4 mg by mouth every 8 (eight) hours as needed for nausea. 04/17/18   [provider]  pantoprazole (PROTONIX) 40 MG tablet Take 40 mg by mouth daily.    [provider]  primidone (MYSOLINE) 50 MG tablet Take 50 mg by mouth daily. 12/20/18   [provider]  PROCTOZONE-HC 2.5 % rectal cream Place 1 application rectally 2 (two) times daily as needed for hemorrhoids. 05/12/19   [provider]  sertraline (ZOLOFT) 100 MG tablet Take 1 tablet (100 mg total) by mouth daily. Patient taking differently: Take 150 mg by mouth daily.  11/28/13 03/20/20  Puthuvel, Iven Finn, MD  topiramate (TOPAMAX) 50 MG tablet Take 50 mg by mouth 2 (two) times daily.    [provider]  torsemide (DEMADEX) 20 MG tablet Take 1 tablet (20 mg total) by mouth daily. Take extra dose in the evening if weight gain more than 3 pounds or short of breath 10/22/19   Levie Heritage, DO  traMADol (ULTRAM) 50 MG tablet Take 50 mg by mouth every 6 (six) hours as needed for moderate pain.     [provider]  Vitamin D, Ergocalciferol, (DRISDOL) 1.25 MG (50000 UT) CAPS capsule Take 50,000 Units by mouth once a week. 08/05/19   [provider]    Allergies    Other, Beta adrenergic blockers, Cephalexin, Codeine, Meperidine hcl, Ace inhibitors, and Levofloxacin  Review of Systems   Review of Systems  Constitutional: Negative for chills and fever.  Respiratory: Negative for shortness of breath.   Cardiovascular: Negative for chest pain.  Gastrointestinal: Negative for abdominal pain, nausea and vomiting.  Musculoskeletal: Positive for arthralgias and myalgias.  Neurological: Positive for headaches. Negative for syncope, weakness and numbness.  All other systems reviewed and are negative.   Physical Exam Updated Vital Signs BP (!) 153/65 (BP  Location: Right Arm)   Pulse (!) 49   Temp 97.9 F (36.6 C) (Oral)   Resp 20   Ht 5\' 2"  (1.575 m)   Wt 83.5 kg   SpO2 94%   BMI 33.65 kg/m   Physical Exam Vitals and nursing note reviewed.  Constitutional:      General: She is not in acute distress.    Appearance: She is well-developed.  HENT:     Head: Normocephalic.     Comments: 1.8 cm cm superficial stellate laceration lateral to the left eyebrow with some overlying swelling.  Mild tenderness to palpation.  No periorbital ecchymosis, battle sign, or rhinorrhea.  Dentition stable.  No tenderness to palpation of the face or scalp otherwise. Eyes:     General:        Right eye: No discharge.        Left eye: No discharge.     Extraocular Movements: Extraocular movements intact.     Conjunctiva/sclera: Conjunctivae normal.  Neck:     Vascular: No JVD.     Trachea: No tracheal deviation.     Comments: No midline cervical spine tenderness.  No deformity, crepitus, or step-off Cardiovascular:     Rate and Rhythm: Normal rate and regular rhythm.  Pulmonary:     Effort: Pulmonary effort is normal.     Breath sounds: Normal breath sounds.  Chest:     Chest wall: No tenderness.  Abdominal:     General: Bowel sounds are normal. There is no distension.     Palpations: Abdomen is soft.     Tenderness: There is no abdominal tenderness.  Musculoskeletal:        General: Tenderness present.     Cervical back: Normal range of motion  and neck supple. No tenderness.     Comments: No midline thoracic or lumbar spine tenderness.  Mild tenderness to palpation of the left buttock with no ecchymosis or sacral tenderness.  Normal passive range of motion of the hips bilaterally pelvis is stable on palpation to compressive force.  5/5 strength of BUE and BLE major muscle groups.  Patient has left wrist pain, mostly tender to palpation along the radial aspect and left snuffbox.  No ecchymosis or swelling.  She is able to flex and extend the wrist  and digits against resistance without difficulty.  Skin:    General: Skin is warm and dry.     Capillary Refill: Capillary refill takes less than 2 seconds.     Findings: No erythema.  Neurological:     General: No focal deficit present.     Mental Status: She is alert.     Comments: Mental Status:  Alert, thought content appropriate, able to give a coherent history. Speech fluent without evidence of aphasia. Able to follow 2 step commands without difficulty.  Cranial Nerves:  II:  Peripheral visual fields grossly normal, pupils equal, round, reactive to light III,IV, VI: ptosis not present, extra-ocular motions intact bilaterally  V,VII: smile symmetric, facial light touch sensation equal VIII: hearing grossly normal to voice  X: uvula elevates symmetrically  XI: bilateral shoulder shrug symmetric and strong XII: midline tongue extension without fassiculations Motor:  Normal tone. 5/5 strength of BUE and BLE major muscle groups including strong and equal grip strength and dorsiflexion/plantar flexion Sensory: light touch normal in all extremities. Gait: Ambulatory with steady gait and balance.  Psychiatric:        Behavior: Behavior normal.     ED Results / Procedures / Treatments   Labs (all labs ordered are listed, but only abnormal results are displayed) Labs Reviewed - No data to display  EKG None  Radiology DG Wrist Complete Left  Result Date: 05/16/2020 CLINICAL DATA:  Acute LEFT wrist pain following fall today. Initial encounter. EXAM: LEFT WRIST - COMPLETE 3+ VIEW COMPARISON:  None. FINDINGS: No acute fracture noted. Moderate severe degenerative changes at the 1st carpometacarpal joint noted. There is also subluxation at the 1st carpometacarpal joint, likely chronic but correlate clinically. No focal bony lesions are present. IMPRESSION: 1. Subluxation at the 1st carpometacarpal joint, likely chronic but correlate clinically. Moderate to severe degenerative changes at  the 1st carpometacarpal joint. 2. No acute fracture. Electronically Signed   By: Harmon Pier M.D.   On: 05/16/2020 17:12   CT Head Wo Contrast  Result Date: 05/16/2020 CLINICAL DATA:  Larey Seat onto left side, left supraorbital laceration, hypertension EXAM: CT HEAD WITHOUT CONTRAST TECHNIQUE: Contiguous axial images were obtained from the base of the skull through the vertex without intravenous contrast. COMPARISON:  03/10/2009 FINDINGS: Brain: There are chronic small vessel ischemic changes within the bilateral frontal periventricular white matter. No signs of acute infarct or hemorrhage. Lateral ventricles and remaining midline structures are unremarkable. No acute extra-axial fluid collections. No mass effect. Vascular: No hyperdense vessel or unexpected calcification. Skull: Normal. Negative for fracture or focal lesion. Sinuses/Orbits: No acute finding. Other: None. IMPRESSION: 1. Stable chronic small vessel ischemic changes. No acute intracranial process. Electronically Signed   By: Sharlet Salina M.D.   On: 05/16/2020 17:22    Procedures .Marland KitchenLaceration Repair  Date/Time: 05/16/2020 6:38 PM Performed by: Jeanie Sewer, PA-C Authorized by: Jeanie Sewer, PA-C   Consent:    Consent obtained:  Verbal  Consent given by:  Patient   Risks discussed:  Infection, need for additional repair, pain, poor cosmetic result and poor wound healing   Alternatives discussed:  No treatment and delayed treatment Universal protocol:    Procedure explained and questions answered to patient or proxy's satisfaction: yes     Relevant documents present and verified: yes     Test results available and properly labeled: yes     Imaging studies available: yes     Required blood products, implants, devices, and special equipment available: yes     Site/side marked: yes     Immediately prior to procedure, a time out was called: yes     Patient identity confirmed:  Verbally with patient Anesthesia (see MAR for exact  dosages):    Anesthesia method:  Local infiltration Repair type:    Repair type:  Simple Pre-procedure details:    Preparation:  Patient was prepped and draped in usual sterile fashion and imaging obtained to evaluate for foreign bodies Exploration:    Hemostasis achieved with:  Direct pressure   Wound exploration: wound explored through full range of motion     Wound extent: no underlying fracture noted     Contaminated: no   Treatment:    Area cleansed with:  Shur-Clens   Irrigation solution:  Sterile saline   Visualized foreign bodies/material removed: no   Skin repair:    Repair method:  Tissue adhesive Approximation:    Approximation:  Close Post-procedure details:    Dressing:  Open (no dressing)   Patient tolerance of procedure:  Tolerated well, no immediate complications   (including critical care time)  Medications Ordered in ED Medications  Tdap (BOOSTRIX) injection 0.5 mL (0.5 mLs Intramuscular Given 05/16/20 1634)  acetaminophen (TYLENOL) tablet 650 mg (650 mg Oral Given 05/16/20 1633)    ED Course  I have reviewed the triage vital signs and the nursing notes.  Pertinent labs & imaging results that were available during my care of the patient were reviewed by me and considered in my medical decision making (see chart for details).    MDM Rules/Calculators/A&P                          Patient presenting for evaluation after mechanical fall.  Landed on her left side and struck her head.  No loss of consciousness.  She is afebrile, vital signs are stable.  She has a superficial laceration just lateral to the left eyebrow.  Bleeding is controlled.  She is anticoagulated on Plavix.  No midline spine tenderness on examination of the chest abdomen and pelvis is atraumatic and reassuring.  She has some mild left buttock pain but is able to bear weight without difficulty and I doubt underlying fracture.  She is neurovascularly intact, alert and oriented to person place time  and events.  Imaging today shows no evidence of intracranial hemorrhage or acute intracranial abnormality, no skull fracture.  Wrist x-ray shows subluxation at the first carpometacarpal joint which could be chronic.  She does have some left snuffbox tenderness.  She tells me that she has had pain to the left wrist intermittently for years mostly related to arthritis.  We will treat conservatively with a thumb spica splint and she will follow-up with PCP or orthopedics for reevaluation and repeat x-rays in 7 to 14 days to rule out missed scaphoid fracture.  Compartments are soft.  Her laceration was amenable to repair with Dermabond.  Base  of wound was visualized in a bloodless field.  She tolerated the procedure without difficulty.  We discussed wound care.  Her tetanus was updated.  Discussed strict ED return precautions.  Patient and husband verbalized understanding of and agreement with plan and patient is stable for discharge at this time.   Final Clinical Impression(s) / ED Diagnoses Final diagnoses:  Fall, initial encounter  Injury of head, initial encounter  Facial laceration, initial encounter  Wrist pain, acute, left  Subluxation of carpometacarpal joint of left thumb, initial encounter    Rx / DC Orders ED Discharge Orders    None       Bennye Alm 05/16/20 1849    Maia Plan, MD 05/23/20 2054

## 2020-05-16 NOTE — ED Triage Notes (Signed)
Tripped and fell over the dog. She hit her head, takes plavix. C/o L wrist and pain to L buttock. Denies LOC.

## 2020-05-16 NOTE — ED Notes (Signed)
Left forehead and area to back of head cleaned with wound cleaner and NS.  Area on scalp very small with bleeding controlled.

## 2020-06-03 ENCOUNTER — Telehealth: Payer: Self-pay | Admitting: Pharmacist

## 2020-06-03 DIAGNOSIS — I208 Other forms of angina pectoris: Secondary | ICD-10-CM

## 2020-06-05 ENCOUNTER — Other Ambulatory Visit: Payer: Self-pay | Admitting: Pharmacist

## 2020-06-05 DIAGNOSIS — I208 Other forms of angina pectoris: Secondary | ICD-10-CM

## 2020-06-05 NOTE — Telephone Encounter (Signed)
Med list reviewed. Pt noted to be taking propanolol and labetalol together. Propanolol started by neurologist recently. Complains of fatigue and low energy; HR in 50s. Denies complains of lightheadedness, dizziness, palpitations. Pt to hold propanolol and review medications with her neurologist. Upcoming appt within the next week.

## 2020-06-06 NOTE — Progress Notes (Signed)
Is she having chest pain?

## 2020-06-08 MED ORDER — NITROGLYCERIN 0.4 MG SL SUBL: 0.4000 mg | SUBLINGUAL_TABLET | SUBLINGUAL | 1 refills | Status: DC | PRN

## 2020-06-08 NOTE — Progress Notes (Signed)
Old Rx was found to expired during med reconciliation. No complains of chest pain.

## 2020-06-25 ENCOUNTER — Ambulatory Visit: Payer: Medicare Other | Admitting: Cardiology

## 2020-07-01 ENCOUNTER — Ambulatory Visit: Payer: Medicare Other | Admitting: Cardiology

## 2020-07-01 ENCOUNTER — Encounter: Payer: Self-pay | Admitting: Cardiology

## 2020-07-01 ENCOUNTER — Other Ambulatory Visit: Payer: Self-pay

## 2020-07-01 VITALS — BP 114/65 | HR 60 | Resp 16 | Ht 62.0 in | Wt 189.0 lb

## 2020-07-01 DIAGNOSIS — I10 Essential (primary) hypertension: Secondary | ICD-10-CM

## 2020-07-01 DIAGNOSIS — E782 Mixed hyperlipidemia: Secondary | ICD-10-CM

## 2020-07-01 DIAGNOSIS — I5032 Chronic diastolic (congestive) heart failure: Secondary | ICD-10-CM

## 2020-07-01 DIAGNOSIS — I208 Other forms of angina pectoris: Secondary | ICD-10-CM

## 2020-07-01 DIAGNOSIS — R9439 Abnormal result of other cardiovascular function study: Secondary | ICD-10-CM

## 2020-07-01 NOTE — Progress Notes (Signed)
Follow up visit  Subjective:   [name] [name], female    DOB: 1951-08-05, 69 y.o.   MRN: [id number]   HPI   Chief Complaint  Patient presents with   Hypertension   Congestive Heart Failure   Follow-up    3 month    69 y.o. Caucasian female with hypertension, hyperlipidemia, history of stroke,HFpEF, abnormal stress test (10/21/2019), COPD, Parkinson's disease.   Patient is doing well from cardiac standpoint. She denies chest pain, shortness of breath, palpitations, leg edema, orthopnea, PND, TIA/syncope. She recently had a mechanical fall, recovering form that. She was started on propranolol for tremors by her. She did not tolerate it due to fatigue. However, she was also on labetalol at the same time.   Home blood pressure log reviewed. Avg BP129/71 HR 61  Initial consultation HPI 10/2019: Patient was admitted to [facility] in 10/2019 with chest pain or shortness of breath. Presentation was thought to be that of hypertensive urgency and acute on chronic HFpEF. However, given mildly elevated HS trop, she underwent nuclear stress test that showed  Moderate-sized fixed defect mid ventricle to apex with questionable region of peri-infarct ischemia at the anterior septal margin. Inn absence of recurrent chest pain, patient was treated medically with diuresis. She was started on losartan given uncontrolled blood pressure and acute on chronic HFpEF. Since discharge, she has not had any recurrent chest pain or shortness of breath symptoms. She is compliant with low salt diet and medications.   Patient has had only occasional episode of chest pain requiring nitroglycerin, roughly once a month.  She has episodes of exertional dyspnea with more than usual physical activity.  She and husband admit that her walking is limited at baseline.  She complains of left arm and leg numbness as well as difficulty grasping objects with her left hand.  On further questioning, she endorses pain and  stiffness in her fingers, worse in the morning.  Blood pressure elevated today.  At home, blood pressure ranges anywhere from 100/50 mmHg to 150/80 mmHg.  Recently, her PCP increased her labetalol dose.  Current Outpatient Medications on File Prior to Visit  Medication Sig Dispense Refill   albuterol (PROVENTIL HFA;VENTOLIN HFA) 108 (90 BASE) MCG/ACT inhaler Inhale 2 puffs into the lungs every 6 (six) hours as needed for wheezing.     ALPRAZolam (XANAX) 0.5 MG tablet Take 0.5 mg by mouth at bedtime as needed for sleep.      Ascorbic Acid (VITA-C PO) Take 1,000 mg by mouth daily.     atorvastatin (LIPITOR) 20 MG tablet Take 20 mg by mouth daily.     cetirizine (ZYRTEC) 10 MG tablet Take 10 mg by mouth daily as needed for allergies.      clopidogrel (PLAVIX) 75 MG tablet Take 75 mg by mouth daily.     cyanocobalamin (,VITAMIN B-12,) 1000 MCG/ML injection Inject 1 mL into the muscle every 30 (thirty) days.     EPIPEN 2-PAK 0.3 MG/0.3ML SOAJ injection Inject 0.3 mg into the muscle as needed for anaphylaxis.      ferrous sulfate 325 (65 FE) MG tablet Take 325 mg by mouth daily with breakfast.     fluticasone furoate-vilanterol (BREO ELLIPTA) 100-25 MCG/INH AEPB Inhale 1 puff into the lungs daily.     gabapentin (NEURONTIN) 300 MG capsule Take 300-600 mg by mouth 2 (two) times daily. Take 356m in the morning and noon; then take 6062mat bedtime     isosorbide mononitrate (IMDUR) 30  MG 24 hr tablet Take 60 mg by mouth daily.      labetalol (NORMODYNE) 200 MG tablet Take 100 mg by mouth 2 (two) times daily. Dose reduced per husband      levothyroxine (SYNTHROID) 50 MCG tablet Take 50 mcg by mouth daily before breakfast.     losartan (COZAAR) 50 MG tablet Take 1 tablet (50 mg total) by mouth daily. (Patient taking differently: Take 50 mg by mouth daily. Taking in the AM) 30 tablet 0   nitroGLYCERIN (NITROSTAT) 0.4 MG SL tablet Place 1 tablet (0.4 mg total) under the tongue every 5 (five)  minutes as needed for chest pain. 30 tablet 1   ondansetron (ZOFRAN-ODT) 4 MG disintegrating tablet Take 4 mg by mouth every 8 (eight) hours as needed for nausea.     pantoprazole (PROTONIX) 40 MG tablet Take 40 mg by mouth daily.     primidone (MYSOLINE) 50 MG tablet Take 100 mg by mouth in the morning and at bedtime.      PROCTOZONE-HC 2.5 % rectal cream Place 1 application rectally 2 (two) times daily as needed for hemorrhoids.     sertraline (ZOLOFT) 100 MG tablet Take 1 tablet (100 mg total) by mouth daily. 30 tablet 1   topiramate (TOPAMAX) 50 MG tablet Take 50 mg by mouth 2 (two) times daily.     torsemide (DEMADEX) 20 MG tablet Take 1 tablet (20 mg total) by mouth daily. Take extra dose in the evening if weight gain more than 3 pounds or short of breath 30 tablet 0   traMADol (ULTRAM) 50 MG tablet Take 50 mg by mouth every 6 (six) hours as needed for moderate pain.      Vitamin D, Ergocalciferol, (DRISDOL) 1.25 MG (50000 UT) CAPS capsule Take 50,000 Units by mouth once a week.     No current facility-administered medications on file prior to visit.    Cardiovascular & other pertient studies:  EKG 11/04/2019: Sinus rhythm 55 bpm. RIght axis deviation. Low voltage. Baseline artifact.  Echocardiogram 10/19/2019: 1. Left ventricular ejection fraction, by visual estimation, is 55 to 60%. The left ventricle has normal function. There is mildly increased left ventricular hypertrophy.  2. Left ventricular diastolic parameters are consistent with Grade I diastolic dysfunction (impaired relaxation).  3. The left ventricle has no regional wall motion abnormalities.  4. Global right ventricle has normal systolic function.The right ventricular size is normal. No increase in right ventricular wall thickness.  5. Left atrial size was mildly dilated.  6. Right atrial size was normal.  7. Moderate pericardial effusion.  8. Presence of pericardial fat pad.  9. The pericardial effusion  is circumferential.  10. Mild mitral annular calcification.  11. The mitral valve is degenerative. No evidence of mitral valve regurgitation.  12. The tricuspid valve is grossly normal. Tricuspid valve regurgitation is trivial.  13. The aortic valve is tricuspid. Aortic valve regurgitation is not visualized. No evidence of aortic valve sclerosis or stenosis.  14. The pulmonic valve was grossly normal. Pulmonic valve regurgitation is not visualized.  15. TR signal is inadequate for assessing pulmonary artery systolic pressure.  Nuclear stress test 10/20/2019: 1. Moderate-sized fixed defect mid ventricle to apex with questionable region of peri-infarct ischemia at the anterior septalmargin. 2. Normal left ventricular wall motion. 3. Left ventricular ejection fraction 54% 4. Non invasive risk stratification*: Low   Recent labs:  10/19/2020:  Troponin I 33.  10/18/2020: BNP 552.4, Troponin I 65.  11/06/2019: Chol 131, TG 122,  HDL 40, LDL 69.  10/22/2019: Glucose 98, BUN/Cr 12/1.08. EGFR 53. Na/K 140/3.7. Rest of the CMP normal H/H 11.8/36.2. MCV 84. Platelets 200   Review of Systems  Cardiovascular: Positive for chest pain (Occasional) and dyspnea on exertion. Negative for leg swelling, palpitations and syncope.  Musculoskeletal: Positive for joint pain (Left hand).  Neurological: Positive for paresthesias (Left arm and left leg).         Vitals:   07/01/20 1138  BP: 114/65  Pulse: 60  Resp: 16  SpO2: 96%     Body mass index is 34.57 kg/m. Filed Weights   07/01/20 1138  Weight: 189 lb (85.7 kg)     Objective:   Physical Exam Vitals and nursing note reviewed.  Constitutional:      General: She is not in acute distress. Neck:     Vascular: No JVD.  Cardiovascular:     Rate and Rhythm: Normal rate and regular rhythm.     Pulses: Intact distal pulses.     Heart sounds: Normal heart sounds. No murmur heard.   Pulmonary:     Effort: Pulmonary effort is  normal.     Breath sounds: Normal breath sounds. No wheezing or rales.  Musculoskeletal:     Comments: Bony deformity left hand           Assessment & Recommendations:    69 y.o. Caucasian female with hypertension, hyperlipidemia, history of stroke,HFpEF, abnormal stress test (10/21/2019), COPD, Parkinson's disease  HFpEF: Chronic.  Continue blood pressure control, low-salt diet, diuretic use.   Hypertension: Controlled. Should propranolol be needed for tremors in future, would recommend discontinuing labetalol.   Chest pain, abnormal stress test (10/2019): No recent chest pain. Recommend medical management. Continue plavix, statin.   F/u in 1 year  Nigel Mormon, MD [facility] Pager: [phone] Office: [phone]

## 2020-07-06 ENCOUNTER — Other Ambulatory Visit: Payer: Self-pay | Admitting: Nurse Practitioner

## 2020-07-06 DIAGNOSIS — Z1231 Encounter for screening mammogram for malignant neoplasm of breast: Secondary | ICD-10-CM

## 2020-07-14 ENCOUNTER — Encounter (HOSPITAL_BASED_OUTPATIENT_CLINIC_OR_DEPARTMENT_OTHER): Payer: Self-pay | Admitting: Emergency Medicine

## 2020-07-14 ENCOUNTER — Emergency Department (HOSPITAL_BASED_OUTPATIENT_CLINIC_OR_DEPARTMENT_OTHER): Payer: Medicare Other

## 2020-07-14 ENCOUNTER — Other Ambulatory Visit: Payer: Self-pay

## 2020-07-14 ENCOUNTER — Emergency Department (HOSPITAL_BASED_OUTPATIENT_CLINIC_OR_DEPARTMENT_OTHER)
Admission: EM | Admit: 2020-07-14 | Discharge: 2020-07-14 | Disposition: A | Discharge status: Home / Self Care | Payer: Medicare Other | Attending: Emergency Medicine | Admitting: Emergency Medicine

## 2020-07-14 DIAGNOSIS — Y929 Unspecified place or not applicable: Secondary | ICD-10-CM | POA: Diagnosis not present

## 2020-07-14 DIAGNOSIS — W19XXXA Unspecified fall, initial encounter: Secondary | ICD-10-CM

## 2020-07-14 DIAGNOSIS — Y999 Unspecified external cause status: Secondary | ICD-10-CM | POA: Diagnosis not present

## 2020-07-14 DIAGNOSIS — E039 Hypothyroidism, unspecified: Secondary | ICD-10-CM | POA: Insufficient documentation

## 2020-07-14 DIAGNOSIS — S022XXA Fracture of nasal bones, initial encounter for closed fracture: Secondary | ICD-10-CM | POA: Diagnosis not present

## 2020-07-14 DIAGNOSIS — S8002XA Contusion of left knee, initial encounter: Secondary | ICD-10-CM | POA: Diagnosis not present

## 2020-07-14 DIAGNOSIS — I509 Heart failure, unspecified: Secondary | ICD-10-CM | POA: Diagnosis not present

## 2020-07-14 DIAGNOSIS — I110 Hypertensive heart disease with heart failure: Secondary | ICD-10-CM | POA: Diagnosis not present

## 2020-07-14 DIAGNOSIS — W010XXA Fall on same level from slipping, tripping and stumbling without subsequent striking against object, initial encounter: Secondary | ICD-10-CM | POA: Diagnosis not present

## 2020-07-14 DIAGNOSIS — Z79899 Other long term (current) drug therapy: Secondary | ICD-10-CM | POA: Insufficient documentation

## 2020-07-14 DIAGNOSIS — Z8673 Personal history of transient ischemic attack (TIA), and cerebral infarction without residual deficits: Secondary | ICD-10-CM | POA: Insufficient documentation

## 2020-07-14 DIAGNOSIS — Z96652 Presence of left artificial knee joint: Secondary | ICD-10-CM | POA: Diagnosis not present

## 2020-07-14 DIAGNOSIS — G20 Parkinson's disease: Secondary | ICD-10-CM | POA: Insufficient documentation

## 2020-07-14 DIAGNOSIS — S0990XA Unspecified injury of head, initial encounter: Secondary | ICD-10-CM | POA: Diagnosis present

## 2020-07-14 DIAGNOSIS — Z79890 Hormone replacement therapy (postmenopausal): Secondary | ICD-10-CM | POA: Diagnosis not present

## 2020-07-14 DIAGNOSIS — Y939 Activity, unspecified: Secondary | ICD-10-CM | POA: Insufficient documentation

## 2020-07-14 MED ORDER — ACETAMINOPHEN 325 MG PO TABS
650.0000 mg | ORAL_TABLET | Freq: Once | ORAL | Status: AC
  Administered 2020-07-14: 650 mg via ORAL
  Filled 2020-07-14: qty 2

## 2020-07-14 NOTE — Discharge Instructions (Addendum)
Plastic surgery is on call tonight for facial trauma.  Information provided above give him a call and set up an appointment.  Avoid messing with the nose.  If it starts to bleed pinch it for 20 minutes.  As we discussed.  Return for any new or worse symptoms.  Expect to be sore and stiff tomorrow.  Expect some bruising under the eyes over time.

## 2020-07-14 NOTE — ED Provider Notes (Signed)
[facility] EMERGENCY DEPARTMENT Provider Note   CSN: [id number] Arrival date & time: 07/14/20  1600     History Chief Complaint  Patient presents with  . Fall    [name] [name] is a 69 y.o. female.  Patient on Plavix but not any other blood thinner. Patient has a history of Parkinson's. She had a fall tripping when stepping up a curb at about 12 noon. Patient denies any loss of consciousness but she did land on her face. No neck pain no back pain no upper extremity pain no numbness or tingling. She did have a nosebleed and has bruising to her nose. And has an abrasion to left knee. No other extremity injuries or complaints. Nosebleed has stopped at this time.        Past Medical History:  Diagnosis Date  . Angioedema   . Depression   . Edema   . HLD (hyperlipidemia)   . HTN (hypertension)   . Mild renal insufficiency   . Mini stroke (HCC)   . Osteopenia    -1.8 03/2010  . Pulmonary nodule   . Sleep apnea    has cpap machine x non compliant due to "panic". Managed by Dr. Jobe Marker n post viral reactive cough periodically since 2002  . Stroke (HCC) 2015  . Thyroid disease   . Tremor    of unclear etiology  . VAIN I (vaginal intraepithelial neoplasia grade I) 2009    Patient Active Problem List   Diagnosis Date Noted  . Stable angina (HCC) 03/20/2020  . Left hand pain 03/20/2020  . Abnormal stress test 11/04/2019  . Mixed hyperlipidemia 11/04/2019  . Chronic heart failure with preserved ejection fraction (HCC) 11/02/2019  . Precordial pain   . CHF exacerbation (HCC) 10/19/2019  . Acute exacerbation of CHF (congestive heart failure) (HCC) 10/19/2019  . Bradycardia 10/19/2019  . Hypothyroidism 07/30/2018  . Generalized anxiety disorder 04/11/2013  . Major depressive disorder, recurrent episode, severe (HCC) 04/11/2013  . HYPOKALEMIA 06/07/2010  . HYPERCHOLESTEROLEMIA-PURE 11/12/2008  . TREMOR 11/12/2008  . SHORTNESS OF BREATH (SOB) 12/25/2007  .  Anxiety and depression 12/08/2007  . Essential hypertension 12/08/2007  . SLEEP APNEA 12/08/2007  . PULMONARY NODULE 12/04/2007  . COUGH 12/04/2007    Past Surgical History:  Procedure Laterality Date  . ABDOMINAL HYSTERECTOMY  2000   TAH  . bilateral tube ligation    . BROW LIFT    . HERNIA REPAIR    . hysterectomy other)    . left knee replacement    . post lumpectomy    . stamey procedure    . WRIST FRACTURE SURGERY  1/12     OB History    Gravida  1   Para  1   Term      Preterm      AB      Living  1     SAB      TAB      Ectopic      Multiple      Live Births           Obstetric Comments  I adopted child        Family History  Problem Relation Age of Onset  . COPD Father        17% capacity  . Heart attack Father        in his 89s  . Diabetes Father   . Hypertension Father   . Hypertension Mother   .  Diabetes Mother   . Heart attack Mother   . Alcohol abuse Maternal Grandfather   . Alcohol abuse Paternal Grandfather   . Seizures Neg Hx     Social History   Tobacco Use  . Smoking status: Never Smoker  . Smokeless tobacco: Never Used  . Tobacco comment: nonsmoker. Passive 1st 20 years of life   Vaping Use  . Vaping Use: Never used  Substance Use Topics  . Alcohol use: Yes    Alcohol/week: 0.5 standard drinks    Types: 1 Standard drinks or equivalent per week    Comment: rare  . Drug use: No    Comment: Coffee  Caffeinated Beverages 24 ounces per day.    Home Medications Prior to Admission medications   Medication Sig Start Date End Date Taking? Authorizing Provider  albuterol (PROVENTIL HFA;VENTOLIN HFA) 108 (90 BASE) MCG/ACT inhaler Inhale 2 puffs into the lungs every 6 (six) hours as needed for wheezing.    [provider]  ALPRAZolam Prudy Feeler(XANAX) 0.5 MG tablet Take 0.5 mg by mouth at bedtime as needed for sleep.     [provider]  Ascorbic Acid (VITA-C PO) Take 1,000 mg by mouth daily.    [provider]  atorvastatin (LIPITOR) 20 MG tablet Take 20 mg by mouth daily.    [provider]  cetirizine (ZYRTEC) 10 MG tablet Take 10 mg by mouth daily as needed for allergies.     [provider]  clopidogrel (PLAVIX) 75 MG tablet Take 75 mg by mouth daily.    [provider]  cyanocobalamin (,VITAMIN B-12,) 1000 MCG/ML injection Inject 1 mL into the muscle every 30 (thirty) days. 10/10/19   [provider]  EPIPEN 2-PAK 0.3 MG/0.3ML SOAJ injection Inject 0.3 mg into the muscle as needed for anaphylaxis.  06/25/13   [provider]  ferrous sulfate 325 (65 FE) MG tablet Take 325 mg by mouth daily with breakfast.    [provider]  fluticasone furoate-vilanterol (BREO ELLIPTA) 100-25 MCG/INH AEPB Inhale 1 puff into the lungs daily. 07/26/19   [provider]  gabapentin (NEURONTIN) 300 MG capsule Take 300-600 mg by mouth 2 (two) times daily. Take 300mg  in the morning and noon; then take 600mg  at bedtime    [provider]  isosorbide mononitrate (IMDUR) 30 MG 24 hr tablet Take 60 mg by mouth daily.  06/10/13     labetalol (NORMODYNE) 200 MG tablet Take 100 mg by mouth 2 (two) times daily. Dose reduced per husband     [provider]  levothyroxine (SYNTHROID) 50 MCG tablet Take 50 mcg by mouth daily before breakfast.    [provider]  losartan (COZAAR) 50 MG tablet Take 1 tablet (50 mg total) by mouth daily. Patient taking differently: Take 50 mg by mouth daily. Taking in the AM 10/23/19   Levie HeritageStinson, Jacob J, DO  nitroGLYCERIN (NITROSTAT) 0.4 MG SL tablet Place 1 tablet (0.4 mg total) under the tongue every 5 (five) minutes as needed for chest pain. 06/08/20   Patwardhan, Manish J, MD  ondansetron (ZOFRAN-ODT) 4 MG disintegrating tablet Take 4 mg by mouth every 8 (eight) hours as needed for nausea. 04/17/18   [provider]  pantoprazole (PROTONIX) 40 MG tablet Take 40 mg by mouth daily.    [provider]  primidone (MYSOLINE) 50 MG tablet Take 100 mg by mouth in the morning and at bedtime.  12/20/18   [provider]  PROCTOZONE-HC 2.5 %  rectal cream Place 1 application rectally 2 (two) times daily as needed for hemorrhoids. 05/12/19   [provider]  sertraline (ZOLOFT) 100 MG tablet Take 1 tablet (100 mg total) by mouth daily. 11/28/13 07/01/20  Puthuvel, Iven Finn, MD  topiramate (TOPAMAX) 50 MG tablet Take 50 mg by mouth 2 (two) times daily.    [provider]  torsemide (DEMADEX) 20 MG tablet Take 1 tablet (20 mg total) by mouth daily. Take extra dose in the evening if weight gain more than 3 pounds or short of breath 10/22/19   Levie Heritage, DO  traMADol (ULTRAM) 50 MG tablet Take 50 mg by mouth every 6 (six) hours as needed for moderate pain.     [provider]  Vitamin D, Ergocalciferol, (DRISDOL) 1.25 MG (50000 UT) CAPS capsule Take 50,000 Units by mouth once a week. 08/05/19   [provider]    Allergies    Other, Beta adrenergic blockers, Cephalexin, Codeine, Meperidine hcl, Ace inhibitors, and Levofloxacin  Review of Systems   Review of Systems  Constitutional: Negative for chills and fever.  HENT: Positive for nosebleeds. Negative for congestion, rhinorrhea, sore throat and trouble swallowing.   Eyes: Negative for visual disturbance.  Respiratory: Negative for cough and shortness of breath.   Cardiovascular: Negative for chest pain and leg swelling.  Gastrointestinal: Negative for abdominal pain, diarrhea, nausea and vomiting.  Genitourinary: Negative for dysuria.  Musculoskeletal: Positive for joint swelling. Negative for back pain and neck pain.  Skin: Negative for rash.  Neurological: Negative for dizziness, light-headedness and headaches.  Hematological: Does not bruise/bleed easily.  Psychiatric/Behavioral: Negative for confusion.    Physical Exam Updated Vital Signs BP (!) 170/75   Pulse (!) 53   Temp 98.8  F (37.1 C) (Oral)   Resp 20   Ht 1.575 m (5\' 2" )   Wt 83.5 kg   SpO2 96%   BMI 33.65 kg/m   Physical Exam Vitals and nursing note reviewed.  Constitutional:      General: She is not in acute distress.    Appearance: Normal appearance. She is well-developed.  HENT:     Head: Normocephalic.     Comments: Some bruising and superficial abrasion to the bridge of the nose. With some swelling. Some evidence of some dried blood in the right nares. Septum does seem to be deviated over to the left. Seems to be some deformity to the bridge of the nose but it is minimal. No active bleeding. Eyes:     Extraocular Movements: Extraocular movements intact.     Conjunctiva/sclera: Conjunctivae normal.     Pupils: Pupils are equal, round, and reactive to light.  Cardiovascular:     Rate and Rhythm: Normal rate and regular rhythm.     Heart sounds: No murmur heard.   Pulmonary:     Effort: Pulmonary effort is normal. No respiratory distress.     Breath sounds: Normal breath sounds.  Abdominal:     Palpations: Abdomen is soft.     Tenderness: There is no abdominal tenderness.  Musculoskeletal:        General: Swelling present. Normal range of motion.     Cervical back: Normal range of motion and neck supple.     Comments: Superficial abrasion to the left knee. With some slight swelling. Good range of motion. Distally good cap refill. Dorsalis pedis pulse 1+. No other extremity injuries.  Skin:    General: Skin is warm and dry.  Capillary Refill: Capillary refill takes less than 2 seconds.  Neurological:     General: No focal deficit present.     Mental Status: She is alert and oriented to person, place, and time.     Cranial Nerves: No cranial nerve deficit.     Sensory: No sensory deficit.     Motor: No weakness.     ED Results / Procedures / Treatments   Labs (all labs ordered are listed, but only abnormal results are displayed) Labs Reviewed - No data to  display  EKG None  Radiology CT Head Wo Contrast  Result Date: 07/14/2020 CLINICAL DATA:  Larey Seat, facial trauma EXAM: CT HEAD WITHOUT CONTRAST CT MAXILLOFACIAL WITHOUT CONTRAST TECHNIQUE: Multidetector CT imaging of the head and maxillofacial structures were performed using the standard protocol without intravenous contrast. Multiplanar CT image reconstructions of the maxillofacial structures were also generated. COMPARISON:  05/16/2020 FINDINGS: CT HEAD FINDINGS Brain: Stable hypodensities within the bilateral basal ganglia and frontal periventricular white matter consistent with chronic small vessel ischemic change. No acute infarct or hemorrhage. Lateral ventricles and remaining midline structures are unremarkable. No acute extra-axial fluid collections. No mass effect. Vascular: No hyperdense vessel or unexpected calcification. Skull: Normal. Negative for fracture or focal lesion. Other: None. CT MAXILLOFACIAL FINDINGS Osseous: Small minimally displaced comminuted fracture at the tip of the nasal bone is noted. No other acute facial bone fractures. Orbits: Negative. No traumatic or inflammatory finding. Sinuses: Minimal polypoid mucosal thickening within the right maxillary sinus. Remaining paranasal sinuses are clear. Soft tissues: Minimal soft tissue swelling overlying the nasal bridge. IMPRESSION: 1. Stable head CT, no acute intracranial process. 2. Minimally displaced fracture at the tip of the nasal bone. Electronically Signed   By: Sharlet Salina M.D.   On: 07/14/2020 17:09   DG Knee Complete 4 Views Left  Result Date: 07/14/2020 CLINICAL DATA:  Anterior left knee pain after fall, abrasion EXAM: LEFT KNEE - COMPLETE 4+ VIEW COMPARISON:  None. FINDINGS: Frontal, bilateral oblique, lateral views of the left knee are obtained. Three component left knee arthroplasty is in the expected position without signs of acute complication. There are no acute displaced fractures. A small joint effusion is noted.  Other soft tissues are unremarkable. IMPRESSION: 1. Unremarkable left knee arthroplasty. 2. Small joint effusion. 3. No acute fracture. Electronically Signed   By: Sharlet Salina M.D.   On: 07/14/2020 17:16   CT Maxillofacial Wo Contrast  Result Date: 07/14/2020 CLINICAL DATA:  Larey Seat, facial trauma EXAM: CT HEAD WITHOUT CONTRAST CT MAXILLOFACIAL WITHOUT CONTRAST TECHNIQUE: Multidetector CT imaging of the head and maxillofacial structures were performed using the standard protocol without intravenous contrast. Multiplanar CT image reconstructions of the maxillofacial structures were also generated. COMPARISON:  05/16/2020 FINDINGS: CT HEAD FINDINGS Brain: Stable hypodensities within the bilateral basal ganglia and frontal periventricular white matter consistent with chronic small vessel ischemic change. No acute infarct or hemorrhage. Lateral ventricles and remaining midline structures are unremarkable. No acute extra-axial fluid collections. No mass effect. Vascular: No hyperdense vessel or unexpected calcification. Skull: Normal. Negative for fracture or focal lesion. Other: None. CT MAXILLOFACIAL FINDINGS Osseous: Small minimally displaced comminuted fracture at the tip of the nasal bone is noted. No other acute facial bone fractures. Orbits: Negative. No traumatic or inflammatory finding. Sinuses: Minimal polypoid mucosal thickening within the right maxillary sinus. Remaining paranasal sinuses are clear. Soft tissues: Minimal soft tissue swelling overlying the nasal bridge. IMPRESSION: 1. Stable head CT, no acute intracranial process. 2. Minimally displaced fracture  at the tip of the nasal bone. Electronically Signed   By: Sharlet Salina M.D.   On: 07/14/2020 17:09    Procedures Procedures (including critical care time)  Medications Ordered in ED Medications  acetaminophen (TYLENOL) tablet 650 mg (650 mg Oral Given 07/14/20 2031)    ED Course  I have reviewed the triage vital signs and the nursing  notes.  Pertinent labs & imaging results that were available during my care of the patient were reviewed by me and considered in my medical decision making (see chart for details).    MDM Rules/Calculators/A&P                          CT head and face consistent with tip of nasal bone fracture.  No active nose bleeding currently.  No other acute findings on the CT head and neck.  X-ray of the left knee unremarkable left knee arthroplasty small joint effusion no acute injury or fracture.  Patient will be treated symptomatically.  Patient referred to maxillofacial trauma with Dr. Krista Blue for follow-up.  Regarding the nosebleed.  Patient given precautions.    Final Clinical Impression(s) / ED Diagnoses Final diagnoses:  Fall, initial encounter  Contusion of left knee, initial encounter  Closed fracture of nasal bone, initial encounter    Rx / DC Orders ED Discharge Orders    None       Vanetta Mulders, MD 07/14/20 2253

## 2020-07-14 NOTE — ED Triage Notes (Signed)
Pt arrives POV with spouse with c/o fall after tripping when stepping up to a curb 2 hrs pta. Pt denies loc., denies neck pain, denies numbness or tingling. Pt endorses facial pain left knee pain  Pt has tremors and speech WNL r/t Parkinsons. Swelling and bruising noted to left knee, swelling to nose. Pt takes plavix

## 2020-07-14 NOTE — ED Notes (Signed)
Pt. Had a fall this afternoon causing injury to the L knee and bridge of her nose.  Pt. Has noted bruising to the bridge of her nose.  Pt. Has an abrasion to the L knee.  Able to bend the L knee and has no bleeding from her nose.

## 2020-07-21 ENCOUNTER — Ambulatory Visit: Payer: Medicare Other | Admitting: Plastic Surgery

## 2020-07-21 ENCOUNTER — Other Ambulatory Visit: Payer: Self-pay

## 2020-07-21 ENCOUNTER — Encounter: Payer: Self-pay | Admitting: Plastic Surgery

## 2020-07-21 DIAGNOSIS — S022XXA Fracture of nasal bones, initial encounter for closed fracture: Secondary | ICD-10-CM | POA: Insufficient documentation

## 2020-07-21 NOTE — Progress Notes (Signed)
Patient ID: [name] [name], female    DOB: 02-Nov-1951, 69 y.o.   MRN: [id number]   Chief Complaint  Patient presents with  . Facial Injury    The patient is a 69 year old female here with her husband for evaluation of her face.  The patient has had several falls in the last several months.  A week ago she had another fall and landed on her face.  She was seen in the emergency department and had some nose bleeding and bruising of her face.  She denied any neck or back pain and no tingling or numbness in her extremities.  By the time she was seen the nose bleeding had stopped.  She has a history of Parkinson's, CVA, hyperlipidemia and hypertension.  She denies any other injuries.  She denies any trouble breathing.  She is here for evaluation of a nasal fracture.  A deviated septum is noted on film and on exam.  This does not appear to be new.  She usually uses a CPAP but she has had a hard time using it due to the swelling and pain.   Review of Systems  Constitutional: Negative.   HENT: Positive for facial swelling.        Swelling from the injury and bruising  Eyes: Negative.   Respiratory: Negative.   Endocrine: Negative.   Genitourinary: Negative.   Musculoskeletal: Positive for gait problem.  Psychiatric/Behavioral:       History of Parkinson's    Past Medical History:  Diagnosis Date  . Angioedema   . Depression   . Edema   . HLD (hyperlipidemia)   . HTN (hypertension)   . Mild renal insufficiency   . Mini stroke (HCC)   . Osteopenia    -1.8 03/2010  . Pulmonary nodule   . Sleep apnea    has cpap machine x non compliant due to "panic". Managed by Dr. Jobe Marker n post viral reactive cough periodically since 2002  . Stroke (HCC) 2015  . Thyroid disease   . Tremor    of unclear etiology  . VAIN I (vaginal intraepithelial neoplasia grade I) 2009    Past Surgical History:  Procedure Laterality Date  . ABDOMINAL HYSTERECTOMY  2000   TAH  . bilateral tube ligation    .  BROW LIFT    . HERNIA REPAIR    . hysterectomy other)    . left knee replacement    . post lumpectomy    . stamey procedure    . WRIST FRACTURE SURGERY  1/12      Current Outpatient Medications:  .  albuterol (PROVENTIL HFA;VENTOLIN HFA) 108 (90 BASE) MCG/ACT inhaler, Inhale 2 puffs into the lungs every 6 (six) hours as needed for wheezing., Disp: , Rfl:  .  ALPRAZolam (XANAX) 0.5 MG tablet, Take 0.5 mg by mouth at bedtime as needed for sleep. , Disp: , Rfl:  .  Ascorbic Acid (VITA-C PO), Take 1,000 mg by mouth daily., Disp: , Rfl:  .  atorvastatin (LIPITOR) 20 MG tablet, Take 20 mg by mouth daily., Disp: , Rfl:  .  cetirizine (ZYRTEC) 10 MG tablet, Take 10 mg by mouth daily as needed for allergies. , Disp: , Rfl:  .  clopidogrel (PLAVIX) 75 MG tablet, Take 75 mg by mouth daily., Disp: , Rfl:  .  cyanocobalamin (,VITAMIN B-12,) 1000 MCG/ML injection, Inject 1 mL into the muscle every 30 (thirty) days., Disp: , Rfl:  .  EPIPEN 2-PAK 0.3  MG/0.3ML SOAJ injection, Inject 0.3 mg into the muscle as needed for anaphylaxis. , Disp: , Rfl:  .  ferrous sulfate 325 (65 FE) MG tablet, Take 325 mg by mouth daily with breakfast., Disp: , Rfl:  .  fluticasone furoate-vilanterol (BREO ELLIPTA) 100-25 MCG/INH AEPB, Inhale 1 puff into the lungs daily., Disp: , Rfl:  .  gabapentin (NEURONTIN) 300 MG capsule, Take 300-600 mg by mouth 2 (two) times daily. Take 300mg  in the morning and noon; then take 600mg  at bedtime, Disp: , Rfl:  .  isosorbide mononitrate (IMDUR) 30 MG 24 hr tablet, Take 60 mg by mouth daily. , Disp: , Rfl:  .  labetalol (NORMODYNE) 200 MG tablet, Take 100 mg by mouth 2 (two) times daily. Dose reduced per husband , Disp: , Rfl:  .  levothyroxine (SYNTHROID) 50 MCG tablet, Take 50 mcg by mouth daily before breakfast., Disp: , Rfl:  .  losartan (COZAAR) 50 MG tablet, Take 1 tablet (50 mg total) by mouth daily. (Patient taking differently: Take 50 mg by mouth daily. Taking in the AM), Disp: 30  tablet, Rfl: 0 .  nitroGLYCERIN (NITROSTAT) 0.4 MG SL tablet, Place 1 tablet (0.4 mg total) under the tongue every 5 (five) minutes as needed for chest pain., Disp: 30 tablet, Rfl: 1 .  ondansetron (ZOFRAN-ODT) 4 MG disintegrating tablet, Take 4 mg by mouth every 8 (eight) hours as needed for nausea., Disp: , Rfl:  .  pantoprazole (PROTONIX) 40 MG tablet, Take 40 mg by mouth daily., Disp: , Rfl:  .  primidone (MYSOLINE) 50 MG tablet, Take 100 mg by mouth in the morning and at bedtime. , Disp: , Rfl:  .  PROCTOZONE-HC 2.5 % rectal cream, Place 1 application rectally 2 (two) times daily as needed for hemorrhoids., Disp: , Rfl:  .  sertraline (ZOLOFT) 100 MG tablet, Take 1 tablet (100 mg total) by mouth daily., Disp: 30 tablet, Rfl: 1 .  topiramate (TOPAMAX) 50 MG tablet, Take 50 mg by mouth 2 (two) times daily., Disp: , Rfl:  .  torsemide (DEMADEX) 20 MG tablet, Take 1 tablet (20 mg total) by mouth daily. Take extra dose in the evening if weight gain more than 3 pounds or short of breath, Disp: 30 tablet, Rfl: 0 .  traMADol (ULTRAM) 50 MG tablet, Take 50 mg by mouth every 6 (six) hours as needed for moderate pain. , Disp: , Rfl:  .  Vitamin D, Ergocalciferol, (DRISDOL) 1.25 MG (50000 UT) CAPS capsule, Take 50,000 Units by mouth once a week., Disp: , Rfl:    Objective:   Vitals:   07/21/20 1523  BP: 130/79  Pulse: 70  Temp: 98.5 F (36.9 C)  SpO2: 97%    Physical Exam Vitals and nursing note reviewed.  HENT:     Head: Normocephalic.  Cardiovascular:     Rate and Rhythm: Normal rate.     Pulses: Normal pulses.  Pulmonary:     Effort: Pulmonary effort is normal.  Abdominal:     General: Abdomen is flat. There is no distension.  Neurological:     Mental Status: She is alert. Mental status is at baseline.  Psychiatric:        Mood and Affect: Mood normal.        Behavior: Behavior normal.        Thought Content: Thought content normal.     Assessment & Plan:  Closed fracture of  nasal bone, initial encounter  It is reasonable to continue to  monitor.  Provided she does not have any trouble breathing she does not need to undergo surgery.  It would be a good idea for her to wait 1 week before she starts the CPAP back.  Alena Bills Leotha Westermeyer, DO

## 2020-08-05 ENCOUNTER — Other Ambulatory Visit: Payer: Self-pay

## 2020-08-05 ENCOUNTER — Ambulatory Visit (INDEPENDENT_AMBULATORY_CARE_PROVIDER_SITE_OTHER): Payer: Medicare Other

## 2020-08-05 DIAGNOSIS — Z1231 Encounter for screening mammogram for malignant neoplasm of breast: Secondary | ICD-10-CM | POA: Diagnosis not present

## 2020-08-06 ENCOUNTER — Encounter: Payer: Medicare Other | Admitting: Nurse Practitioner

## 2020-08-11 ENCOUNTER — Encounter: Payer: Medicare Other | Admitting: Nurse Practitioner

## 2021-01-19 ENCOUNTER — Other Ambulatory Visit: Payer: Self-pay

## 2021-01-19 ENCOUNTER — Encounter (HOSPITAL_BASED_OUTPATIENT_CLINIC_OR_DEPARTMENT_OTHER): Payer: Self-pay

## 2021-01-19 ENCOUNTER — Emergency Department (HOSPITAL_BASED_OUTPATIENT_CLINIC_OR_DEPARTMENT_OTHER)
Admission: EM | Admit: 2021-01-19 | Discharge: 2021-01-19 | Disposition: A | Discharge status: Home / Self Care | Payer: Medicare Other | Attending: Emergency Medicine | Admitting: Emergency Medicine

## 2021-01-19 ENCOUNTER — Emergency Department (HOSPITAL_BASED_OUTPATIENT_CLINIC_OR_DEPARTMENT_OTHER): Payer: Medicare Other

## 2021-01-19 DIAGNOSIS — Z96652 Presence of left artificial knee joint: Secondary | ICD-10-CM | POA: Insufficient documentation

## 2021-01-19 DIAGNOSIS — Z79899 Other long term (current) drug therapy: Secondary | ICD-10-CM | POA: Diagnosis not present

## 2021-01-19 DIAGNOSIS — Y92002 Bathroom of unspecified non-institutional (private) residence single-family (private) house as the place of occurrence of the external cause: Secondary | ICD-10-CM | POA: Insufficient documentation

## 2021-01-19 DIAGNOSIS — E039 Hypothyroidism, unspecified: Secondary | ICD-10-CM | POA: Diagnosis not present

## 2021-01-19 DIAGNOSIS — I110 Hypertensive heart disease with heart failure: Secondary | ICD-10-CM | POA: Insufficient documentation

## 2021-01-19 DIAGNOSIS — W182XXA Fall in (into) shower or empty bathtub, initial encounter: Secondary | ICD-10-CM | POA: Insufficient documentation

## 2021-01-19 DIAGNOSIS — M25531 Pain in right wrist: Secondary | ICD-10-CM | POA: Diagnosis not present

## 2021-01-19 DIAGNOSIS — I509 Heart failure, unspecified: Secondary | ICD-10-CM | POA: Insufficient documentation

## 2021-01-19 DIAGNOSIS — S0990XA Unspecified injury of head, initial encounter: Secondary | ICD-10-CM | POA: Diagnosis present

## 2021-01-19 DIAGNOSIS — W19XXXA Unspecified fall, initial encounter: Secondary | ICD-10-CM

## 2021-01-19 DIAGNOSIS — M545 Low back pain, unspecified: Secondary | ICD-10-CM | POA: Insufficient documentation

## 2021-01-19 DIAGNOSIS — S0083XA Contusion of other part of head, initial encounter: Secondary | ICD-10-CM | POA: Insufficient documentation

## 2021-01-19 NOTE — Discharge Instructions (Addendum)
Your workup today was reassuring.  Your xray/CT was without abnormality.  Take tylenol [phone] mg every 6 hours as needed for pain.  Drink plenty of water.  Please follow up with your primary care provider.   Please wear the wrist splint that you are provided.  Please follow-up with your primary care doctor for recheck of a scaphoid x-ray in 7-10 days.

## 2021-01-19 NOTE — ED Triage Notes (Addendum)
Pt states she fell in her bathroom last night-landed in the tub-c/o pain to "back of head", right wrist, mid back pain-no LOC-states she does take plavix-NAD-slow gait-no break in skin noted to head or wrist

## 2021-01-19 NOTE — ED Provider Notes (Signed)
[facility] EMERGENCY DEPARTMENT Provider Note   CSN: [id number] Arrival date & time: 01/19/21  1642     History Chief Complaint  Patient presents with  . Fall    [name] [name] is a 70 y.o. female.  HPI Patient is a 70 year old female presented today after a fall that occurred last night.  She states that she over balance while trying to flush the toilet and stepped backwards and in doing so tripped and fell into the bathtub.  She states that she landed on her butt and smacked the back of her head against the tile.  She states that she has some right wrist pain that is achy but now seems to be improved.  Some mild low back pain which she states is also been improved without any medications.  No other associated injuries or complaints today.  She denies any chest pain, belly pain, headache or shortness of breath prior to or after the fall.  She had no loss of consciousness, nausea or vomiting.  No other significant symptoms.    Past Medical History:  Diagnosis Date  . Angioedema   . Depression   . Edema   . HLD (hyperlipidemia)   . HTN (hypertension)   . Mild renal insufficiency   . Mini stroke (HCC)   . Osteopenia    -1.8 03/2010  . Pulmonary nodule   . Sleep apnea    has cpap machine x non compliant due to "panic". Managed by Dr. Jobe MarkerMike Litica n post viral reactive cough periodically since 2002  . Stroke (HCC) 2015  . Thyroid disease   . Tremor    of unclear etiology  . VAIN I (vaginal intraepithelial neoplasia grade I) 2009    Patient Active Problem List   Diagnosis Date Noted  . Nasal fracture 07/21/2020  . Stable angina (HCC) 03/20/2020  . Left hand pain 03/20/2020  . Abnormal stress test 11/04/2019  . Mixed hyperlipidemia 11/04/2019  . Chronic heart failure with preserved ejection fraction (HCC) 11/02/2019  . Precordial pain   . CHF exacerbation (HCC) 10/19/2019  . Acute exacerbation of CHF (congestive heart failure) (HCC) 10/19/2019  . Bradycardia  10/19/2019  . Hypothyroidism 07/30/2018  . Generalized anxiety disorder 04/11/2013  . Major depressive disorder, recurrent episode, severe (HCC) 04/11/2013  . HYPOKALEMIA 06/07/2010  . HYPERCHOLESTEROLEMIA-PURE 11/12/2008  . TREMOR 11/12/2008  . SHORTNESS OF BREATH (SOB) 12/25/2007  . Anxiety and depression 12/08/2007  . Essential hypertension 12/08/2007  . SLEEP APNEA 12/08/2007  . PULMONARY NODULE 12/04/2007  . COUGH 12/04/2007    Past Surgical History:  Procedure Laterality Date  . ABDOMINAL HYSTERECTOMY  2000   TAH  . bilateral tube ligation    . BROW LIFT    . HERNIA REPAIR    . hysterectomy other)    . left knee replacement    . post lumpectomy    . stamey procedure    . WRIST FRACTURE SURGERY  1/12     OB History    Gravida  1   Para  1   Term      Preterm      AB      Living  1     SAB      IAB      Ectopic      Multiple      Live Births           Obstetric Comments  I adopted child  Family History  Problem Relation Age of Onset  . COPD Father        17% capacity  . Heart attack Father        in his 90s  . Diabetes Father   . Hypertension Father   . Hypertension Mother   . Diabetes Mother   . Heart attack Mother   . Alcohol abuse Maternal Grandfather   . Alcohol abuse Paternal Grandfather   . Seizures Neg Hx     Social History   Tobacco Use  . Smoking status: Never Smoker  . Smokeless tobacco: Never Used  . Tobacco comment: nonsmoker. Passive 1st 20 years of life   Vaping Use  . Vaping Use: Never used  Substance Use Topics  . Alcohol use: Yes    Alcohol/week: 0.5 standard drinks    Types: 1 Standard drinks or equivalent per week    Comment: rare  . Drug use: No    Comment: Coffee  Caffeinated Beverages 24 ounces per day.    Home Medications Prior to Admission medications   Medication Sig Start Date End Date Taking? Authorizing Provider  albuterol (PROVENTIL HFA;VENTOLIN HFA) 108 (90 BASE) MCG/ACT inhaler  Inhale 2 puffs into the lungs every 6 (six) hours as needed for wheezing.    [provider]  ALPRAZolam Prudy Feeler) 0.5 MG tablet Take 0.5 mg by mouth at bedtime as needed for sleep.     [provider]  Ascorbic Acid (VITA-C PO) Take 1,000 mg by mouth daily.    [provider]  atorvastatin (LIPITOR) 20 MG tablet Take 20 mg by mouth daily.    [provider]  cetirizine (ZYRTEC) 10 MG tablet Take 10 mg by mouth daily as needed for allergies.     [provider]  clopidogrel (PLAVIX) 75 MG tablet Take 75 mg by mouth daily.    [provider]  cyanocobalamin (,VITAMIN B-12,) 1000 MCG/ML injection Inject 1 mL into the muscle every 30 (thirty) days. 10/10/19   [provider]  EPIPEN 2-PAK 0.3 MG/0.3ML SOAJ injection Inject 0.3 mg into the muscle as needed for anaphylaxis.  06/25/13   [provider]  ferrous sulfate 325 (65 FE) MG tablet Take 325 mg by mouth daily with breakfast.    [provider]  fluticasone furoate-vilanterol (BREO ELLIPTA) 100-25 MCG/INH AEPB Inhale 1 puff into the lungs daily. 07/26/19   [provider]  gabapentin (NEURONTIN) 300 MG capsule Take 300-600 mg by mouth 2 (two) times daily. Take 300mg  in the morning and noon; then take 600mg  at bedtime    [provider]  isosorbide mononitrate (IMDUR) 30 MG 24 hr tablet Take 60 mg by mouth daily.  06/10/13     labetalol (NORMODYNE) 200 MG tablet Take 100 mg by mouth 2 (two) times daily. Dose reduced per husband     [provider]  levothyroxine (SYNTHROID) 50 MCG tablet Take 50 mcg by mouth daily before breakfast.    [provider]  losartan (COZAAR) 50 MG tablet Take 1 tablet (50 mg total) by mouth daily. Patient taking differently: Take 50 mg by mouth daily. Taking in the AM 10/23/19   08/10/13, DO  nitroGLYCERIN (NITROSTAT) 0.4 MG SL tablet Place 1 tablet (0.4 mg total) under the tongue every 5 (five) minutes as  needed for chest pain. 06/08/20   Patwardhan, Manish J, MD  ondansetron (ZOFRAN-ODT) 4 MG disintegrating tablet Take 4 mg by mouth every 8 (eight) hours as needed for  nausea. 04/17/18   [provider]  pantoprazole (PROTONIX) 40 MG tablet Take 40 mg by mouth daily.    [provider]  primidone (MYSOLINE) 50 MG tablet Take 100 mg by mouth in the morning and at bedtime.  12/20/18   [provider]  PROCTOZONE-HC 2.5 % rectal cream Place 1 application rectally 2 (two) times daily as needed for hemorrhoids. 05/12/19   [provider]  sertraline (ZOLOFT) 100 MG tablet Take 1 tablet (100 mg total) by mouth daily. 11/28/13 07/01/20  Puthuvel, Iven Finn, MD  topiramate (TOPAMAX) 50 MG tablet Take 50 mg by mouth 2 (two) times daily.    [provider]  torsemide (DEMADEX) 20 MG tablet Take 1 tablet (20 mg total) by mouth daily. Take extra dose in the evening if weight gain more than 3 pounds or short of breath 10/22/19   Levie Heritage, DO  traMADol (ULTRAM) 50 MG tablet Take 50 mg by mouth every 6 (six) hours as needed for moderate pain.     [provider]  Vitamin D, Ergocalciferol, (DRISDOL) 1.25 MG (50000 UT) CAPS capsule Take 50,000 Units by mouth once a week. 08/05/19   [provider]    Allergies    Other, Beta adrenergic blockers, Cephalexin, Codeine, Meperidine hcl, Ace inhibitors, and Levofloxacin  Review of Systems   Review of Systems  Constitutional: Negative for fever.  HENT: Negative for congestion.   Respiratory: Negative for shortness of breath.   Cardiovascular: Negative for chest pain.  Gastrointestinal: Negative for abdominal distention.  Musculoskeletal:       Right wrist pain Low back pain Pain @back  of head  Neurological: Positive for headaches. Negative for dizziness.    Physical Exam Updated Vital Signs BP (!) 160/68 (BP Location: Left Arm)   Pulse 65   Temp 98 F (36.7 C) (Oral)   Resp 20   Ht 5\' 2"  (1.575  m)   Wt 87.1 kg   SpO2 90%   BMI 35.12 kg/m   Physical Exam Vitals and nursing note reviewed.  Constitutional:      General: She is not in acute distress.    Appearance: She is obese.  HENT:     Head: Normocephalic and atraumatic.     Nose: Nose normal.     Mouth/Throat:     Mouth: Mucous membranes are moist.  Eyes:     General: No scleral icterus. Cardiovascular:     Rate and Rhythm: Normal rate and regular rhythm.     Pulses: Normal pulses.     Heart sounds: Normal heart sounds.  Pulmonary:     Effort: Pulmonary effort is normal. No respiratory distress.     Breath sounds: Normal breath sounds. No wheezing.  Abdominal:     Palpations: Abdomen is soft.     Tenderness: There is no abdominal tenderness. There is no guarding or rebound.  Musculoskeletal:     Cervical back: Normal range of motion.     Right lower leg: No edema.     Left lower leg: No edema.     Comments: TTP of the right snuffbox region. No other significant TTP of metacarpals/wrist.   No other bony tenderness over joints or long bones of the upper and lower extremities.  No neck or back midline tenderness, step-off, deformity, or bruising. Able to turn head left and right 45 degrees without difficulty.   Full range of motion of upper and lower extremity joints shown after palpation was conducted; with  5/5 symmetrical strength in upper and lower extremities. No chest wall tenderness, no facial or cranial tenderness.   Patient has intact sensation grossly in lower and upper extremities. Intact patellar and ankle reflexes. Patient able to ambulate without difficulty.  Radial and DP pulses palpated BL.   Skin:    General: Skin is warm and dry.     Capillary Refill: Capillary refill takes less than 2 seconds.  Neurological:     Mental Status: She is alert. Mental status is at baseline.  Psychiatric:        Mood and Affect: Mood normal.        Behavior: Behavior normal.     ED Results / Procedures /  Treatments   Labs (all labs ordered are listed, but only abnormal results are displayed) Labs Reviewed - No data to display  EKG None  Radiology DG Wrist Complete Right  Result Date: 01/19/2021 CLINICAL DATA:  Right wrist pain after injury, fall last night in the tub. EXAM: RIGHT WRIST - COMPLETE 3+ VIEW COMPARISON:  None. FINDINGS: No evidence of acute fracture. Mild ulna positive variance. Slight downsloping of the distal radius may be congenital or sequela of remote injury. Degenerative change of the triscaphe articulation. Occasional degenerative carpal bone cysts. Included metacarpals are intact. Mild soft tissue edema. IMPRESSION: No acute fracture or subluxation of the right wrist. Mild soft tissue edema. Electronically Signed   By: Narda Rutherford M.D.   On: 01/19/2021 18:44   CT Head Wo Contrast  Result Date: 01/19/2021 CLINICAL DATA:  Head trauma. Patient reports falling in bathroom last evening hitting back of head. Currently on blood thinners. EXAM: CT HEAD WITHOUT CONTRAST TECHNIQUE: Contiguous axial images were obtained from the base of the skull through the vertex without intravenous contrast. COMPARISON:  07/14/2020 FINDINGS: Brain: No evidence of acute infarction, hemorrhage, hydrocephalus, extra-axial collection or mass lesion/mass effect. Prominence of the sulci and ventricles compatible with brain atrophy. There is mild diffuse low-attenuation within the subcortical and periventricular white matter compatible with chronic microvascular disease. Vascular: No hyperdense vessel or unexpected calcification. Skull: Normal. Negative for fracture or focal lesion. Sinuses/Orbits: Retention cyst versus polyp noted in the right maxillary sinus. Other: None. IMPRESSION: 1. No acute intracranial abnormalities. 2. Chronic small vessel ischemic change and brain atrophy. Electronically Signed   By: Signa Kell M.D.   On: 01/19/2021 18:54    Procedures Procedures   Medications Ordered in  ED Medications - No data to display  ED Course  I have reviewed the triage vital signs and the nursing notes.  Pertinent labs & imaging results that were available during my care of the patient were reviewed by me and considered in my medical decision making (see chart for details).    MDM Rules/Calculators/A&P                          Patient is a 70 year old female presented today after a fall that occurred last night.  She did strike her head she did not lose consciousness.  She has right wrist pain and pain in the back of her head.  Physical exam is unremarkable.  Right wrist does have some mild tenderness to palpation forehead since it is close enough to the scaphoid will place patient in a volar splint and recommend x-ray imaging with PCP in 7-10 days.  X-ray was negative for fracture.  Reviewed the x-ray and agree of radiology read.  CT of head without contrast  is without any acute abnormality.  Patient understanding of plan to discharge home.  Will recommend close follow-up with PCP to review med list for polypharmacy, follow-up with podiatrist for foot care and return precautions were given.  Patient is agreeable to plan.  Will discharge at this time with her husband.  Final Clinical Impression(s) / ED Diagnoses Final diagnoses:  Fall, initial encounter  Contusion of other part of head, initial encounter  Right wrist pain    Rx / DC Orders ED Discharge Orders    None       Gailen Shelter, [place] 01/19/21 1954    Virgina Norfolk, DO 01/19/21 2312

## 2021-01-26 ENCOUNTER — Encounter: Payer: Self-pay | Admitting: Pharmacist

## 2021-01-26 NOTE — Progress Notes (Signed)
CARE PLAN ENTRY  01/26/2021 Name: [name] [name] MRN: [id number] DOB: 1951-03-14  [name] is enrolled in Remote Patient Monitoring/Principle Care Monitoring.  Date of Enrollment: 03/20/20 Supervising physician: Truett Mainland Indication: HTN  Remote Readings: Not Compliant and Avg BP: 142/79, HR:62  Next scheduled OV: 07/01/21  Pharmacist Clinical Goal(s):  Marland Kitchen Over the next 90 days, patient will demonstrate Improved medication adherence as evidenced by medication fill history . Over the next 90 days, patient will demonstrate improved understanding of prescribed medications and rationale for usage as evidenced by patient teach back . Over the next 90 days, patient will experience decrease in ED visits. ED visits in last 6 months = 3 . Over the next 90 days, patient will not experience hospital admission. Hospital Admissions in last 6 months = 0  Interventions: . Provider and Inter-disciplinary care team collaboration (see longitudinal plan of care) . Comprehensive medication review performed. . Discussed plans with patient for ongoing care management follow up and provided patient with direct contact information for care management team . Collaboration with provider re: medication management  Patient Self Care Activities:  . Self administers medications as prescribed . Attends all scheduled provider appointments . Performs ADL's independently . Performs IADL's independently  Allergies  Allergen Reactions  . Other Other (See Comments)    PHENYLEDIAMINE - CAUSES ANGIO EDEMA  . Beta Adrenergic Blockers   . Cephalexin Itching  . Codeine Itching  . Meperidine Hcl   . Ace Inhibitors Cough    Coughing.   . Levofloxacin Other (See Comments)    Urine burned with odor   Outpatient Encounter Medications as of 01/26/2021  Medication Sig  . albuterol (PROVENTIL HFA;VENTOLIN HFA) 108 (90 BASE) MCG/ACT inhaler Inhale 2 puffs into the lungs every 6 (six) hours as needed for  wheezing.  Marland Kitchen ALPRAZolam (XANAX) 0.5 MG tablet Take 0.5 mg by mouth at bedtime as needed for sleep.   . Ascorbic Acid (VITA-C PO) Take 1,000 mg by mouth daily.  Marland Kitchen atorvastatin (LIPITOR) 20 MG tablet Take 20 mg by mouth daily.  . cetirizine (ZYRTEC) 10 MG tablet Take 10 mg by mouth daily as needed for allergies.   Marland Kitchen clopidogrel (PLAVIX) 75 MG tablet Take 75 mg by mouth daily.  . cyanocobalamin (,VITAMIN B-12,) 1000 MCG/ML injection Inject 1 mL into the muscle every 30 (thirty) days.  Marland Kitchen EPIPEN 2-PAK 0.3 MG/0.3ML SOAJ injection Inject 0.3 mg into the muscle as needed for anaphylaxis.   . ferrous sulfate 325 (65 FE) MG tablet Take 325 mg by mouth daily with breakfast.  . fluticasone furoate-vilanterol (BREO ELLIPTA) 100-25 MCG/INH AEPB Inhale 1 puff into the lungs daily.  Marland Kitchen gabapentin (NEURONTIN) 300 MG capsule Take 300-600 mg by mouth 2 (two) times daily. Take 300mg  in the morning and noon; then take 600mg  at bedtime  . isosorbide mononitrate (IMDUR) 30 MG 24 hr tablet Take 60 mg by mouth daily.   labetalol (NORMODYNE) 200 MG tablet Take 100 mg by mouth 2 (two) times daily. Dose reduced per husband   . levothyroxine (SYNTHROID) 50 MCG tablet Take 50 mcg by mouth daily before breakfast.  . losartan (COZAAR) 50 MG tablet Take 1 tablet (50 mg total) by mouth daily. (Patient taking differently: Take 50 mg by mouth daily. Taking in the AM)  . nitroGLYCERIN (NITROSTAT) 0.4 MG SL tablet Place 1 tablet (0.4 mg total) under the tongue every 5 (five) minutes as needed for chest pain.  ondansetron (ZOFRAN-ODT) 4 MG disintegrating  tablet Take 4 mg by mouth every 8 (eight) hours as needed for nausea.  . pantoprazole (PROTONIX) 40 MG tablet Take 40 mg by mouth daily.  . primidone (MYSOLINE) 50 MG tablet Take 150 mg by mouth in the morning and at bedtime.  Marland Kitchen PROCTOZONE-HC 2.5 % rectal cream Place 1 application rectally 2 (two) times daily as needed for hemorrhoids.  . sertraline (ZOLOFT) 100 MG tablet Take 1  tablet (100 mg total) by mouth daily.  Marland Kitchen topiramate (TOPAMAX) 50 MG tablet Take 50 mg by mouth 2 (two) times daily.  Marland Kitchen torsemide (DEMADEX) 20 MG tablet Take 1 tablet (20 mg total) by mouth daily. Take extra dose in the evening if weight gain more than 3 pounds or short of breath  . traMADol (ULTRAM) 50 MG tablet Take 50 mg by mouth every 6 (six) hours as needed for moderate pain.   . Vitamin D, Ergocalciferol, (DRISDOL) 1.25 MG (50000 UT) CAPS capsule Take 50,000 Units by mouth once a week.   No facility-administered encounter medications on file as of 01/26/2021.    Hypertension   BP goal is:  <130/80  Office blood pressures are  BP Readings from Last 3 Encounters:  01/19/21 (!) 165/65  07/21/20 130/79  07/14/20 (!) 170/75    Patient is currently uncontrolled on the following medications: imdur 60 mg, labetalol 100 mg BID, losartan 50 mg, torsemide 20 mg,   Patient checks BP at home 1-2x per week  Patient home BP readings are ranging: 124-153/66-89  Patient has tried these meds in the past: propanolol, clonidine, minoxidil  We discussed diet and exercise extensively  Plan  Continue current medications and control with diet and exercise   Pt recovering from recent viral infection, BP improving.   May consider increasing losartan dose if BP continues to remain elevated.   ______________ Visit Information SDOH (Social Determinants of Health) assessments performed: Yes.  Ms. Sandquist was given information about Principle Care Management/Remote Patient Monitoring services today including:  1. RPM/PCM service includes personalized support from designated clinical staff supervised by her physician, including individualized plan of care and coordination with other care providers 2. 24/7 contact phone numbers for assistance for urgent and routine care needs. 3. Standard insurance, coinsurance, copays and deductibles apply for principle care management only during months in which we  provide at least 30 minutes of these services. Most insurances cover these services at 100%, however patients may be responsible for any copay, coinsurance and/or deductible if applicable. This service may help you avoid the need for more expensive face-to-face services. 4. Only one practitioner may furnish and bill the service in a calendar month. 5. The patient may stop PCM/RPM services at any time (effective at the end of the month) by phone call to the office staff.  Patient agreed to services and verbal consent obtained.   Cassell Clement, Pharm.D. Clinical Pharmacist [facility] [phone] Ext: [phone]

## 2021-05-07 ENCOUNTER — Emergency Department (HOSPITAL_COMMUNITY)
Admission: EM | Admit: 2021-05-07 | Discharge: 2021-05-08 | Disposition: A | Discharge status: Home / Self Care | Payer: Medicare Other | Attending: Emergency Medicine | Admitting: Emergency Medicine

## 2021-05-07 ENCOUNTER — Emergency Department (HOSPITAL_COMMUNITY): Payer: Medicare Other

## 2021-05-07 ENCOUNTER — Other Ambulatory Visit: Payer: Self-pay

## 2021-05-07 DIAGNOSIS — R0789 Other chest pain: Secondary | ICD-10-CM | POA: Diagnosis not present

## 2021-05-07 DIAGNOSIS — I509 Heart failure, unspecified: Secondary | ICD-10-CM | POA: Diagnosis not present

## 2021-05-07 DIAGNOSIS — Z96652 Presence of left artificial knee joint: Secondary | ICD-10-CM | POA: Diagnosis not present

## 2021-05-07 DIAGNOSIS — I110 Hypertensive heart disease with heart failure: Secondary | ICD-10-CM | POA: Diagnosis not present

## 2021-05-07 DIAGNOSIS — E039 Hypothyroidism, unspecified: Secondary | ICD-10-CM | POA: Insufficient documentation

## 2021-05-07 DIAGNOSIS — Z79899 Other long term (current) drug therapy: Secondary | ICD-10-CM | POA: Diagnosis not present

## 2021-05-07 LAB — CBC
HCT: 37.7 % (ref 36.0–46.0)
Hemoglobin: 11.7 g/dL — ABNORMAL LOW (ref 12.0–15.0)
MCH: 28.8 pg (ref 26.0–34.0)
MCHC: 31 g/dL (ref 30.0–36.0)
MCV: 92.9 fL (ref 80.0–100.0)
Platelets: 192 10*3/uL (ref 150–400)
RBC: 4.06 MIL/uL (ref 3.87–5.11)
RDW: 14.4 % (ref 11.5–15.5)
WBC: 6.1 10*3/uL (ref 4.0–10.5)
nRBC: 0 % (ref 0.0–0.2)

## 2021-05-07 LAB — BASIC METABOLIC PANEL
Anion gap: 8 (ref 5–15)
BUN: 13 mg/dL (ref 8–23)
CO2: 28 mmol/L (ref 22–32)
Calcium: 9 mg/dL (ref 8.9–10.3)
Chloride: 106 mmol/L (ref 98–111)
Creatinine, Ser: 1.03 mg/dL — ABNORMAL HIGH (ref 0.44–1.00)
GFR, Estimated: 59 mL/min — ABNORMAL LOW (ref >60)
Glucose, Bld: 97 mg/dL (ref 70–99)
Potassium: 3.6 mmol/L (ref 3.5–5.1)
Sodium: 142 mmol/L (ref 135–145)

## 2021-05-07 LAB — TROPONIN I (HIGH SENSITIVITY)
Troponin I (High Sensitivity): 5 ng/L (ref <18)
Troponin I (High Sensitivity): 5 ng/L (ref <18)

## 2021-05-07 NOTE — ED Triage Notes (Signed)
Pt BIB [facility] for centralized CP/pressure, radiation down L arm. Given 324ASA, took one of home NTG, pain's resolved.  hx CHF, HTN, CVA Pt at neuro baseline

## 2021-05-08 ENCOUNTER — Other Ambulatory Visit: Payer: Self-pay

## 2021-05-08 LAB — TROPONIN I (HIGH SENSITIVITY): Troponin I (High Sensitivity): 6 ng/L (ref <18)

## 2021-05-08 NOTE — ED Notes (Signed)
Discharge instructions reviewed with the patient. The patient verbalized understanding of instructions. Patient discharged.  ?

## 2021-05-08 NOTE — ED Provider Notes (Signed)
[facility] EMERGENCY DEPARTMENT Provider Note   CSN: [id number] Arrival date & time: 05/07/21  2056     History Chief Complaint  Patient presents with   Chest Pain    [name] [name] is a 70 y.o. female.  Pt presents to the ED today with CP.  Pt said she was talking with her rabbi and some friends after the service and suddenly felt a squeezing pain go across her chest.  She said it was sudden in onset and was very painful.  She took 1 of her nitroglycerin tablets, but it did not work.  However, it had expired in Feb of 2021.  She called EMS who gave her 324 mg asa.  Pain resolved after the ASA. She came here and unfortunately waited for about 10 hours to be seen.  She has not had any more cp since arrival here.      Past Medical History:  Diagnosis Date   Angioedema    Depression    Edema    HLD (hyperlipidemia)    HTN (hypertension)    Mild renal insufficiency    Mini stroke (HCC)    Osteopenia    -1.8 03/2010   Pulmonary nodule    Sleep apnea    has cpap machine x non compliant due to "panic". Managed by Dr. Jobe Marker n post viral reactive cough periodically since 2002   Stroke [facility]) 2015   Thyroid disease    Tremor    of unclear etiology   VAIN I (vaginal intraepithelial neoplasia grade I) 2009    Patient Active Problem List   Diagnosis Date Noted   Nasal fracture 07/21/2020   Stable angina (HCC) 03/20/2020   Left hand pain 03/20/2020   Abnormal stress test 11/04/2019   Mixed hyperlipidemia 11/04/2019   Chronic heart failure with preserved ejection fraction (HCC) 11/02/2019   Precordial pain    CHF exacerbation (HCC) 10/19/2019   Acute exacerbation of CHF (congestive heart failure) (HCC) 10/19/2019   Bradycardia 10/19/2019   Hypothyroidism 07/30/2018   Generalized anxiety disorder 04/11/2013   Major depressive disorder, recurrent episode, severe (HCC) 04/11/2013   HYPOKALEMIA 06/07/2010   HYPERCHOLESTEROLEMIA-PURE 11/12/2008   TREMOR  11/12/2008   SHORTNESS OF BREATH (SOB) 12/25/2007   Anxiety and depression 12/08/2007   Essential hypertension 12/08/2007   SLEEP APNEA 12/08/2007   PULMONARY NODULE 12/04/2007   COUGH 12/04/2007    Past Surgical History:  Procedure Laterality Date   ABDOMINAL HYSTERECTOMY  2000   TAH   bilateral tube ligation     BROW LIFT     HERNIA REPAIR     hysterectomy other)     left knee replacement     post lumpectomy     stamey procedure     WRIST FRACTURE SURGERY  1/12     OB History     Gravida  1   Para  1   Term      Preterm      AB      Living  1      SAB      IAB      Ectopic      Multiple      Live Births           Obstetric Comments  I adopted child         Family History  Problem Relation Age of Onset   COPD Father        17% capacity  Heart attack Father        in his 4450s   Diabetes Father    Hypertension Father    Hypertension Mother    Diabetes Mother    Heart attack Mother    Alcohol abuse Maternal Grandfather    Alcohol abuse Paternal Grandfather    Seizures Neg Hx     Social History   Tobacco Use   Smoking status: Never   Smokeless tobacco: Never   Tobacco comments:    nonsmoker. Passive 1st 20 years of life   Vaping Use   Vaping Use: Never used  Substance Use Topics   Alcohol use: Yes    Alcohol/week: 0.5 standard drinks    Types: 1 Standard drinks or equivalent per week    Comment: rare   Drug use: No    Comment: Coffee  Caffeinated Beverages 24 ounces per day.    Home Medications Prior to Admission medications   Medication Sig Start Date End Date Taking? Authorizing Provider  albuterol (PROVENTIL HFA;VENTOLIN HFA) 108 (90 BASE) MCG/ACT inhaler Inhale 2 puffs into the lungs every 6 (six) hours as needed for wheezing.   Yes [provider]  ALPRAZolam Prudy Feeler(XANAX) 0.5 MG tablet Take 0.5 mg by mouth at bedtime as needed for sleep.    Yes [provider]  Ascorbic Acid (VITA-C PO) Take 1,000 mg by  mouth daily.   Yes [provider]  atorvastatin (LIPITOR) 20 MG tablet Take 20 mg by mouth daily.   Yes [provider]  cetirizine (ZYRTEC) 10 MG tablet Take 10 mg by mouth daily as needed for allergies.    Yes [provider]  clopidogrel (PLAVIX) 75 MG tablet Take 75 mg by mouth daily.   Yes [provider]  cyanocobalamin (,VITAMIN B-12,) 1000 MCG/ML injection Inject 1 mL into the muscle every 30 (thirty) days. 10/10/19  Yes [provider]  EPIPEN 2-PAK 0.3 MG/0.3ML SOAJ injection Inject 0.3 mg into the muscle as needed for anaphylaxis.  06/25/13  Yes [provider]  ferrous sulfate 325 (65 FE) MG tablet Take 325 mg by mouth daily with breakfast.   Yes [provider]  fluticasone furoate-vilanterol (BREO ELLIPTA) 100-25 MCG/INH AEPB Inhale 1 puff into the lungs daily. 07/26/19  Yes [provider]  gabapentin (NEURONTIN) 300 MG capsule Take 300-600 mg by mouth 2 (two) times daily. Take 300mg  in the morning and noon; then take 600mg  at bedtime   Yes [provider]  isosorbide mononitrate (IMDUR) 30 MG 24 hr tablet Take 60 mg by mouth daily.  06/10/13  Yes   labetalol (NORMODYNE) 200 MG tablet Take 100 mg by mouth 2 (two) times daily. Dose reduced per husband    Yes [provider]  levothyroxine (SYNTHROID) 50 MCG tablet Take 50 mcg by mouth daily before breakfast.   Yes [provider]  losartan (COZAAR) 50 MG tablet Take 1 tablet (50 mg total) by mouth daily. Patient taking differently: Take 50 mg by mouth daily. Taking in the AM 10/23/19  Yes Levie HeritageStinson, Jacob J, DO  nitroGLYCERIN (NITROSTAT) 0.4 MG SL tablet Place 1 tablet (0.4 mg total) under the tongue every 5 (five) minutes as needed for chest pain. 06/08/20  Yes Patwardhan, Manish J, MD  ondansetron (ZOFRAN-ODT) 4 MG disintegrating tablet Take 4 mg by mouth every 8 (eight) hours as needed for nausea. 04/17/18  Yes [provider]   pantoprazole (PROTONIX) 40 MG tablet Take 40 mg by mouth daily.   Yes  [provider]  primidone (MYSOLINE) 50 MG tablet Take 150 mg by mouth in the morning and at bedtime. 12/20/18  Yes [provider]  PROCTOZONE-HC 2.5 % rectal cream Place 1 application rectally 2 (two) times daily as needed for hemorrhoids. 05/12/19  Yes [provider]  sertraline (ZOLOFT) 100 MG tablet Take 1 tablet (100 mg total) by mouth daily. 11/28/13 05/08/21 Yes Puthuvel, Iven Finn, MD  topiramate (TOPAMAX) 50 MG tablet Take 50 mg by mouth 2 (two) times daily.   Yes [provider]  torsemide (DEMADEX) 20 MG tablet Take 1 tablet (20 mg total) by mouth daily. Take extra dose in the evening if weight gain more than 3 pounds or short of breath 10/22/19  Yes Levie Heritage, DO  traMADol (ULTRAM) 50 MG tablet Take 50 mg by mouth every 6 (six) hours as needed for moderate pain.    Yes [provider]  Vitamin D, Ergocalciferol, (DRISDOL) 1.25 MG (50000 UT) CAPS capsule Take 50,000 Units by mouth once a week. 08/05/19  Yes [provider]    Allergies    Other, Beta adrenergic blockers, Cephalexin, Codeine, Meperidine hcl, Ace inhibitors, and Levofloxacin  Review of Systems   Review of Systems  Cardiovascular:  Positive for chest pain.  All other systems reviewed and are negative.  Physical Exam Updated Vital Signs BP (!) 148/70   Pulse (!) 55   Temp 98.9 F (37.2 C)   Resp 14   SpO2 95%   Physical Exam Vitals and nursing note reviewed.  Constitutional:      Appearance: She is well-developed.  HENT:     Head: Normocephalic and atraumatic.  Eyes:     Extraocular Movements: Extraocular movements intact.     Pupils: Pupils are equal, round, and reactive to light.  Cardiovascular:     Rate and Rhythm: Normal rate and regular rhythm.     Heart sounds: Normal heart sounds.  Pulmonary:     Effort: Pulmonary effort is normal.     Breath sounds: Normal breath  sounds.  Abdominal:     General: Bowel sounds are normal.     Palpations: Abdomen is soft.  Musculoskeletal:        General: Normal range of motion.     Cervical back: Normal range of motion and neck supple.  Skin:    General: Skin is warm.     Capillary Refill: Capillary refill takes less than 2 seconds.  Neurological:     General: No focal deficit present.     Mental Status: She is alert and oriented to person, place, and time.  Psychiatric:        Mood and Affect: Mood normal.        Behavior: Behavior normal.    ED Results / Procedures / Treatments   Labs (all labs ordered are listed, but only abnormal results are displayed) Labs Reviewed  BASIC METABOLIC PANEL - Abnormal; Notable for the following components:      Result Value   Creatinine, Ser 1.03 (*)    GFR, Estimated 59 (*)    All other components within normal limits  CBC - Abnormal; Notable for the following components:   Hemoglobin 11.7 (*)    All other components within normal limits  TROPONIN I (HIGH SENSITIVITY)  TROPONIN I (HIGH SENSITIVITY)  TROPONIN I (HIGH SENSITIVITY)    EKG EKG Interpretation  Date/Time:  Saturday May 08 2021 07:10:26 EDT Ventricular Rate:  57 PR Interval:  234 QRS  Duration: 110 QT Interval:  513 QTC Calculation: 500 R Axis:   113 Text Interpretation: Sinus rhythm Prolonged PR interval Right axis deviation Borderline low voltage, extremity leads Borderline prolonged QT interval No significant change since last tracing Confirmed by Jacalyn Lefevre [phone]) on 05/08/2021 7:13:35 AM  Radiology DG Chest 2 View  Result Date: 05/07/2021 CLINICAL DATA:  Chest pain. EXAM: CHEST - 2 VIEW COMPARISON:  October 19, 2019 FINDINGS: A trace amount of linear atelectasis is seen within the lateral aspect of the right lung base. There is no evidence of acute infiltrate, pleural effusion or pneumothorax. The heart size and mediastinal contours are within normal limits. The visualized skeletal  structures are unremarkable. IMPRESSION: No active cardiopulmonary disease. Electronically Signed   By: Aram Candela M.D.   On: 05/07/2021 21:31    Procedures Procedures   Medications Ordered in ED Medications - No data to display  ED Course  I have reviewed the triage vital signs and the nursing notes.  Pertinent labs & imaging results that were available during my care of the patient were reviewed by me and considered in my medical decision making (see chart for details).    MDM Rules/Calculators/A&P                          Pt has had a cardiac work up in triage which was nl.  Repeat EKG this am nl.  Repeat troponin 12 hrs after pain nl.  Pt is stable for d/c.  Return if worse.  F/u with pcp.  Final Clinical Impression(s) / ED Diagnoses Final diagnoses:  Atypical chest pain    Rx / DC Orders ED Discharge Orders     None        Jacalyn Lefevre, MD 05/08/21 [phone]

## 2021-05-28 ENCOUNTER — Other Ambulatory Visit: Payer: Self-pay | Admitting: Pharmacist

## 2021-05-28 DIAGNOSIS — I208 Other forms of angina pectoris: Secondary | ICD-10-CM

## 2021-05-28 MED ORDER — NITROGLYCERIN 0.4 MG SL SUBL: 0.4000 mg | SUBLINGUAL_TABLET | SUBLINGUAL | 1 refills | Status: DC | PRN

## 2021-07-01 ENCOUNTER — Encounter: Payer: Self-pay | Admitting: Cardiology

## 2021-07-01 ENCOUNTER — Ambulatory Visit: Payer: Medicare Other | Admitting: Cardiology

## 2021-07-01 ENCOUNTER — Other Ambulatory Visit: Payer: Self-pay

## 2021-07-01 VITALS — BP 155/76 | HR 62 | Temp 97.3°F | Resp 16 | Ht 62.0 in | Wt 190.8 lb

## 2021-07-01 DIAGNOSIS — I208 Other forms of angina pectoris: Secondary | ICD-10-CM

## 2021-07-01 DIAGNOSIS — E782 Mixed hyperlipidemia: Secondary | ICD-10-CM

## 2021-07-01 DIAGNOSIS — I10 Essential (primary) hypertension: Secondary | ICD-10-CM

## 2021-07-01 MED ORDER — NITROGLYCERIN 0.4 MG SL SUBL: 0.4000 mg | SUBLINGUAL_TABLET | SUBLINGUAL | 3 refills | Status: DC | PRN

## 2021-07-01 MED ORDER — AMLODIPINE BESYLATE 5 MG PO TABS: 5.0000 mg | ORAL_TABLET | Freq: Every day | ORAL | 3 refills | Status: DC

## 2021-07-01 MED ORDER — ATORVASTATIN CALCIUM 40 MG PO TABS: 20.0000 mg | ORAL_TABLET | Freq: Every day | ORAL | 3 refills | Status: DC

## 2021-07-01 NOTE — Progress Notes (Signed)
Follow up visit  Subjective:   [name] [name], female    DOB: December 17, 1950, 70 y.o.   MRN: [id number]   HPI   Chief Complaint  Patient presents with   Congestive Heart Failure   Follow-up    1 year    70 y.o. Caucasian female with hypertension, hyperlipidemia, h/o recurrent stroke, HFpEF, abnormal stress test (10/21/2019), COPD, Parkinson's disease.   Patient was seen in [facility] emergency room on 05/08/2021 with complaints of chest pain.  Patient was sitting with her friends when she had sudden onset retrosternal chest tightness lasting for several minutes.  Nitroglycerin used by her did not help, but Entresto given by EMS did help her chest pain.  Work-up in the ER ruled out ACS.   Other than this 1 episode of chest pain, she is not any other symptoms.  Her lifestyle remains sedentary, sits difficult to evaluate for any exertional component of chest pain symptoms.  Blood pressure is elevated today, and has been increasing over the past few days.  Initial consultation HPI 10/2019: Patient was admitted to [facility] in 10/2019 with chest pain or shortness of breath. Presentation was thought to be that of hypertensive urgency and acute on chronic HFpEF. However, given mildly elevated HS trop, she underwent nuclear stress test that showed  Moderate-sized fixed defect mid ventricle to apex with questionable region of peri-infarct ischemia at the anterior septal margin. Inn absence of recurrent chest pain, patient was treated medically with diuresis. She was started on losartan given uncontrolled blood pressure and acute on chronic HFpEF. Since discharge, she has not had any recurrent chest pain or shortness of breath symptoms. She is compliant with low salt diet and medications.   Patient has had only occasional episode of chest pain requiring nitroglycerin, roughly once a month.  She has episodes of exertional dyspnea with more than usual physical activity.  She and husband admit that  her walking is limited at baseline.  She complains of left arm and leg numbness as well as difficulty grasping objects with her left hand.  On further questioning, she endorses pain and stiffness in her fingers, worse in the morning.  Blood pressure elevated today.  At home, blood pressure ranges anywhere from 100/50 mmHg to 150/80 mmHg.  Recently, her PCP increased her labetalol dose.  Current Outpatient Medications on File Prior to Visit  Medication Sig Dispense Refill   albuterol (PROVENTIL HFA;VENTOLIN HFA) 108 (90 BASE) MCG/ACT inhaler Inhale 2 puffs into the lungs every 6 (six) hours as needed for wheezing.     ALPRAZolam (XANAX) 0.5 MG tablet Take 0.5 mg by mouth at bedtime as needed for sleep.      Ascorbic Acid (VITA-C PO) Take 1,000 mg by mouth daily.     atorvastatin (LIPITOR) 20 MG tablet Take 20 mg by mouth daily.     cetirizine (ZYRTEC) 10 MG tablet Take 10 mg by mouth daily as needed for allergies.      clopidogrel (PLAVIX) 75 MG tablet Take 75 mg by mouth daily.     cyanocobalamin (,VITAMIN B-12,) 1000 MCG/ML injection Inject 1 mL into the muscle every 30 (thirty) days.     EPIPEN 2-PAK 0.3 MG/0.3ML SOAJ injection Inject 0.3 mg into the muscle as needed for anaphylaxis.      ferrous sulfate 325 (65 FE) MG tablet Take 325 mg by mouth daily with breakfast.     fluticasone furoate-vilanterol (BREO ELLIPTA) 100-25 MCG/INH AEPB Inhale 1 puff into the lungs  daily.     gabapentin (NEURONTIN) 300 MG capsule Take 300-600 mg by mouth 2 (two) times daily. Take 323m in the morning and noon; then take 6064mat bedtime     isosorbide mononitrate (IMDUR) 30 MG 24 hr tablet Take 60 mg by mouth daily.      labetalol (NORMODYNE) 200 MG tablet Take 100 mg by mouth 2 (two) times daily. Dose reduced per husband      levothyroxine (SYNTHROID) 50 MCG tablet Take 50 mcg by mouth daily before breakfast.     losartan (COZAAR) 50 MG tablet Take 1 tablet (50 mg total) by mouth daily. (Patient taking  differently: Take 50 mg by mouth daily. Taking in the AM) 30 tablet 0   nitroGLYCERIN (NITROSTAT) 0.4 MG SL tablet Place 1 tablet (0.4 mg total) under the tongue every 5 (five) minutes as needed for chest pain. 30 tablet 1   ondansetron (ZOFRAN-ODT) 4 MG disintegrating tablet Take 4 mg by mouth every 8 (eight) hours as needed for nausea.     pantoprazole (PROTONIX) 40 MG tablet Take 40 mg by mouth daily.     primidone (MYSOLINE) 50 MG tablet Take 150 mg by mouth in the morning and at bedtime.     PROCTOZONE-HC 2.5 % rectal cream Place 1 application rectally 2 (two) times daily as needed for hemorrhoids.     sertraline (ZOLOFT) 100 MG tablet Take 1 tablet (100 mg total) by mouth daily. 30 tablet 1   topiramate (TOPAMAX) 50 MG tablet Take 50 mg by mouth 2 (two) times daily.     torsemide (DEMADEX) 20 MG tablet Take 1 tablet (20 mg total) by mouth daily. Take extra dose in the evening if weight gain more than 3 pounds or short of breath 30 tablet 0   traMADol (ULTRAM) 50 MG tablet Take 50 mg by mouth every 6 (six) hours as needed for moderate pain.      Vitamin D, Ergocalciferol, (DRISDOL) 1.25 MG (50000 UT) CAPS capsule Take 50,000 Units by mouth once a week.     No current facility-administered medications on file prior to visit.    Cardiovascular & other pertient studies:  EKG 07/01/2021: Sinus rhythm 60 bpm  Low voltage, otherwise normal EKG  EKG 11/04/2019: Sinus rhythm 55 bpm. RIght axis deviation. Low voltage. Baseline artifact.  Echocardiogram 10/19/2019: 1. Left ventricular ejection fraction, by visual estimation, is 55 to 60%. The left ventricle has normal function. There is mildly increased left ventricular hypertrophy.  2. Left ventricular diastolic parameters are consistent with Grade I diastolic dysfunction (impaired relaxation).  3. The left ventricle has no regional wall motion abnormalities.  4. Global right ventricle has normal systolic function.The right ventricular size  is normal. No increase in right ventricular wall thickness.  5. Left atrial size was mildly dilated.  6. Right atrial size was normal.  7. Moderate pericardial effusion.  8. Presence of pericardial fat pad.  9. The pericardial effusion is circumferential.  10. Mild mitral annular calcification.  11. The mitral valve is degenerative. No evidence of mitral valve regurgitation.  12. The tricuspid valve is grossly normal. Tricuspid valve regurgitation is trivial.  13. The aortic valve is tricuspid. Aortic valve regurgitation is not visualized. No evidence of aortic valve sclerosis or stenosis.  14. The pulmonic valve was grossly normal. Pulmonic valve regurgitation is not visualized.  15. TR signal is inadequate for assessing pulmonary artery systolic pressure.  Nuclear stress test 10/20/2019: 1. Moderate-sized fixed defect mid ventricle to apex  with questionable region of peri-infarct ischemia at the anterior septalmargin. 2. Normal left ventricular wall motion. 3. Left ventricular ejection fraction 54% 4. Non invasive risk stratification*: Low   Recent labs: 05/07/2021: Glucose 97, BUN/Cr 13/1.03. EGFR 59. Na/K 142/3.6.  H/H 11/37. MCV 92. Platelets 192  10/19/2020:  Troponin I 33.  10/18/2020: BNP 552.4, Troponin I 65.  11/06/2019: Chol 131, TG 122, HDL 40, LDL 69.  10/22/2019: Glucose 98, BUN/Cr 12/1.08. EGFR 53. Na/K 140/3.7. Rest of the CMP normal H/H 11.8/36.2. MCV 84. Platelets 200   Review of Systems  Cardiovascular:  Positive for chest pain (One episode, as per HPI). Negative for dyspnea on exertion, leg swelling, palpitations and syncope.  Musculoskeletal:  Positive for joint pain (Left hand).  Neurological:  Positive for paresthesias (Left arm and left leg).        Vitals:   07/01/21 1036 07/01/21 1047  BP: (!) 158/84 (!) 155/76  Pulse: 61 62  Resp: 16   Temp: (!) 97.3 F (36.3 C)   SpO2: 98% 96%     Body mass index is 34.9 kg/m. Filed Weights    07/01/21 1036  Weight: 190 lb 12.8 oz (86.5 kg)     Objective:   Physical Exam Vitals and nursing note reviewed.  Constitutional:      General: She is not in acute distress. Neck:     Vascular: No JVD.  Cardiovascular:     Rate and Rhythm: Normal rate and regular rhythm.     Pulses: Intact distal pulses.     Heart sounds: Normal heart sounds. No murmur heard. Pulmonary:     Effort: Pulmonary effort is normal.     Breath sounds: Normal breath sounds. No wheezing or rales.  Musculoskeletal:     Right lower leg: No edema.     Left lower leg: No edema.     Comments: Bony deformity left hand          Assessment & Recommendations:    70 y.o. Caucasian female with hypertension, hyperlipidemia, h/o recurrent stroke, HFpEF, abnormal stress test (10/21/2019), COPD, Parkinson's disease.   Chest pain: One episode of chest pain, possible unstable angina. Previous stress test in 10/2019 with mild abnormalities. Given her relatively sedentary lifestyle, prefer medical management unless high risk stress test. Will repeat Lexiscan nuclear stress test. Added amlodipine 5 mg daily. Continue plavix, statin. Increased Lipitor to 40 mg daily.   HFpEF: Chronic. Stable.  Hypertension: Uncontrolled. Added amlodipine, as above.  F/u after stress test  Nigel Mormon, MD [facility] Pager: [phone] Office: [phone]

## 2021-07-28 ENCOUNTER — Ambulatory Visit: Payer: Medicare Other

## 2021-07-28 ENCOUNTER — Other Ambulatory Visit: Payer: Self-pay

## 2021-07-28 DIAGNOSIS — I208 Other forms of angina pectoris: Secondary | ICD-10-CM

## 2021-07-28 DIAGNOSIS — I2089 Other forms of angina pectoris: Secondary | ICD-10-CM

## 2021-08-06 ENCOUNTER — Ambulatory Visit: Payer: Medicare Other | Admitting: Cardiology

## 2021-08-06 ENCOUNTER — Other Ambulatory Visit: Payer: Self-pay

## 2021-08-06 ENCOUNTER — Encounter: Payer: Self-pay | Admitting: Cardiology

## 2021-08-06 VITALS — BP 127/71 | HR 58 | Temp 98.1°F | Ht 62.0 in | Wt 188.0 lb

## 2021-08-06 DIAGNOSIS — I2089 Other forms of angina pectoris: Secondary | ICD-10-CM

## 2021-08-06 DIAGNOSIS — I208 Other forms of angina pectoris: Secondary | ICD-10-CM

## 2021-08-06 DIAGNOSIS — E782 Mixed hyperlipidemia: Secondary | ICD-10-CM

## 2021-08-06 DIAGNOSIS — I10 Essential (primary) hypertension: Secondary | ICD-10-CM

## 2021-08-06 DIAGNOSIS — I5032 Chronic diastolic (congestive) heart failure: Secondary | ICD-10-CM

## 2021-08-06 MED ORDER — AMLODIPINE BESYLATE 5 MG PO TABS: 5.0000 mg | ORAL_TABLET | Freq: Every day | ORAL | 3 refills | Status: AC

## 2021-08-06 MED ORDER — CETIRIZINE HCL 10 MG PO TABS: 10.0000 mg | ORAL_TABLET | Freq: Every day | ORAL | 3 refills | Status: DC | PRN

## 2021-08-06 MED ORDER — ASPIRIN EC 81 MG PO TBEC: 81.0000 mg | DELAYED_RELEASE_TABLET | Freq: Every day | ORAL | 3 refills | Status: DC

## 2021-08-06 MED ORDER — ATORVASTATIN CALCIUM 40 MG PO TABS: 20.0000 mg | ORAL_TABLET | Freq: Every day | ORAL | 3 refills | Status: DC

## 2021-08-06 NOTE — Progress Notes (Signed)
Follow up visit  Subjective:   [name] [name], female    DOB: July 08, 1951, 70 y.o.   MRN: [id number]   HPI   Chief Complaint  Patient presents with   Chronic heart failure with preserved ejection fraction    Follow-up   Results    70 y.o. Caucasian female with hypertension, hyperlipidemia, h/o recurrent stroke, HFpEF, abnormal stress test (10/21/2019), COPD, Parkinson's disease.   Patient was seen in [facility] emergency room on 05/08/2021 with complaints of chest pain.  Patient was sitting with her friends when she had sudden onset retrosternal chest tightness lasting for several minutes.  Nitroglycerin used by her did not help, but Entresto given by EMS did help her chest pain.  Work-up in the ER ruled out ACS. Subsequent SPECT MPI in 07/2021 showed no ischemia.  Patient is here for follow-up visit today.  She has not had any recurrent chest pain.  Blood pressure is better controlled since starting amlodipine 5 mg daily.   Initial consultation HPI 10/2019: Patient was admitted to [facility] in 10/2019 with chest pain or shortness of breath. Presentation was thought to be that of hypertensive urgency and acute on chronic HFpEF. However, given mildly elevated HS trop, she underwent nuclear stress test that showed  Moderate-sized fixed defect mid ventricle to apex with questionable region of peri-infarct ischemia at the anterior septal margin. Inn absence of recurrent chest pain, patient was treated medically with diuresis. She was started on losartan given uncontrolled blood pressure and acute on chronic HFpEF. Since discharge, she has not had any recurrent chest pain or shortness of breath symptoms. She is compliant with low salt diet and medications.   Patient has had only occasional episode of chest pain requiring nitroglycerin, roughly once a month.  She has episodes of exertional dyspnea with more than usual physical activity.  She and husband admit that her walking is limited at  baseline.  She complains of left arm and leg numbness as well as difficulty grasping objects with her left hand.  On further questioning, she endorses pain and stiffness in her fingers, worse in the morning.  Blood pressure elevated today.  At home, blood pressure ranges anywhere from 100/50 mmHg to 150/80 mmHg.  Recently, her PCP increased her labetalol dose.  Current Outpatient Medications on File Prior to Visit  Medication Sig Dispense Refill   albuterol (PROVENTIL HFA;VENTOLIN HFA) 108 (90 BASE) MCG/ACT inhaler Inhale 2 puffs into the lungs every 6 (six) hours as needed for wheezing.     ALPRAZolam (XANAX) 0.5 MG tablet Take 0.5 mg by mouth at bedtime as needed for sleep.      amLODipine (NORVASC) 5 MG tablet Take 1 tablet (5 mg total) by mouth daily. 30 tablet 3   Ascorbic Acid (VITA-C PO) Take 1,000 mg by mouth daily.     atorvastatin (LIPITOR) 40 MG tablet Take 0.5 tablets (20 mg total) by mouth daily. 30 tablet 3   cetirizine (ZYRTEC) 10 MG tablet Take 10 mg by mouth daily as needed for allergies.      clopidogrel (PLAVIX) 75 MG tablet Take 75 mg by mouth daily.     cyanocobalamin (,VITAMIN B-12,) 1000 MCG/ML injection Inject 1 mL into the muscle every 30 (thirty) days.     EPIPEN 2-PAK 0.3 MG/0.3ML SOAJ injection Inject 0.3 mg into the muscle as needed for anaphylaxis.      ferrous sulfate 325 (65 FE) MG tablet Take 325 mg by mouth daily with breakfast.  fluticasone furoate-vilanterol (BREO ELLIPTA) 100-25 MCG/INH AEPB Inhale 1 puff into the lungs daily.     gabapentin (NEURONTIN) 300 MG capsule Take 300-600 mg by mouth 2 (two) times daily. Take $RemoveBef'300mg'[id number]$  in the morning and noon; then take $RemoveB'600mg'[id number]$  at bedtime     isosorbide mononitrate (IMDUR) 30 MG 24 hr tablet Take 60 mg by mouth daily.      labetalol (NORMODYNE) 200 MG tablet Take 100 mg by mouth 2 (two) times daily. Dose reduced per husband      levothyroxine (SYNTHROID) 50 MCG tablet Take 50 mcg by mouth daily before breakfast.      losartan (COZAAR) 50 MG tablet Take 1 tablet (50 mg total) by mouth daily. (Patient taking differently: Take 50 mg by mouth daily. Taking in the AM) 30 tablet 0   nitroGLYCERIN (NITROSTAT) 0.4 MG SL tablet Place 1 tablet (0.4 mg total) under the tongue every 5 (five) minutes as needed for chest pain. 30 tablet 3   ondansetron (ZOFRAN-ODT) 4 MG disintegrating tablet Take 4 mg by mouth every 8 (eight) hours as needed for nausea.     pantoprazole (PROTONIX) 40 MG tablet Take 40 mg by mouth daily.     primidone (MYSOLINE) 50 MG tablet Take 150 mg by mouth in the morning and at bedtime.     PROCTOZONE-HC 2.5 % rectal cream Place 1 application rectally 2 (two) times daily as needed for hemorrhoids.     sertraline (ZOLOFT) 100 MG tablet Take 1 tablet (100 mg total) by mouth daily. 30 tablet 1   topiramate (TOPAMAX) 50 MG tablet Take 50 mg by mouth 2 (two) times daily.     torsemide (DEMADEX) 20 MG tablet Take 1 tablet (20 mg total) by mouth daily. Take extra dose in the evening if weight gain more than 3 pounds or short of breath 30 tablet 0   traMADol (ULTRAM) 50 MG tablet Take 50 mg by mouth every 6 (six) hours as needed for moderate pain.      Vitamin D, Ergocalciferol, (DRISDOL) 1.25 MG (50000 UT) CAPS capsule Take 50,000 Units by mouth once a week.     No current facility-administered medications on file prior to visit.    Cardiovascular & other pertient studies:  Lexiscan Tetrofosmin stress test 07/28/2021: Lexiscan nuclear stress test performed using 1-day protocol.? Normal myocardial perfusion. Stress LVEF 54%.? Low risk study.?   EKG 07/01/2021: Sinus rhythm 60 bpm  Low voltage, otherwise normal EKG  Echocardiogram 10/19/2019: 1. Left ventricular ejection fraction, by visual estimation, is 55 to 60%. The left ventricle has normal function. There is mildly increased left ventricular hypertrophy.  2. Left ventricular diastolic parameters are consistent with Grade I diastolic dysfunction  (impaired relaxation).  3. The left ventricle has no regional wall motion abnormalities.  4. Global right ventricle has normal systolic function.The right ventricular size is normal. No increase in right ventricular wall thickness.  5. Left atrial size was mildly dilated.  6. Right atrial size was normal.  7. Moderate pericardial effusion.  8. Presence of pericardial fat pad.  9. The pericardial effusion is circumferential.  10. Mild mitral annular calcification.  11. The mitral valve is degenerative. No evidence of mitral valve regurgitation.  12. The tricuspid valve is grossly normal. Tricuspid valve regurgitation is trivial.  13. The aortic valve is tricuspid. Aortic valve regurgitation is not visualized. No evidence of aortic valve sclerosis or stenosis.  14. The pulmonic valve was grossly normal. Pulmonic valve regurgitation is not visualized.  15.  TR signal is inadequate for assessing pulmonary artery systolic pressure.  Nuclear stress test 10/20/2019: 1. Moderate-sized fixed defect mid ventricle to apex with questionable region of peri-infarct ischemia at the anterior septalmargin. 2. Normal left ventricular wall motion. 3. Left ventricular ejection fraction 54% 4. Non invasive risk stratification*: Low   Recent labs: 05/07/2021: Glucose 97, BUN/Cr 13/1.03. EGFR 59. Na/K 142/3.6.  H/H 11/37. MCV 92. Platelets 192  10/19/2020:  Troponin I 33.  10/18/2020: BNP 552.4, Troponin I 65.  11/06/2019: Chol 131, TG 122, HDL 40, LDL 69.  10/22/2019: Glucose 98, BUN/Cr 12/1.08. EGFR 53. Na/K 140/3.7. Rest of the CMP normal H/H 11.8/36.2. MCV 84. Platelets 200   Review of Systems  Cardiovascular:  Negative for chest pain, dyspnea on exertion, leg swelling, palpitations and syncope.  Musculoskeletal:  Positive for joint pain (Left hand).  Neurological:  Positive for paresthesias (Left arm and left leg).        Vitals:   08/06/21 1109  BP: 127/71  Pulse: (!) 58  Temp: 98.1  F (36.7 C)  SpO2: 95%     Body mass index is 34.39 kg/m. Filed Weights   08/06/21 1109  Weight: 188 lb (85.3 kg)     Objective:   Physical Exam Vitals and nursing note reviewed.  Constitutional:      General: She is not in acute distress. Neck:     Vascular: No JVD.  Cardiovascular:     Rate and Rhythm: Normal rate and regular rhythm.     Pulses: Intact distal pulses.     Heart sounds: Normal heart sounds. No murmur heard. Pulmonary:     Effort: Pulmonary effort is normal.     Breath sounds: Normal breath sounds. No wheezing or rales.  Musculoskeletal:     Right lower leg: No edema.     Left lower leg: No edema.     Comments: Bony deformity left hand          Assessment & Recommendations:   70 y.o. Caucasian female with hypertension, hyperlipidemia, h/o recurrent stroke, HFpEF, abnormal stress test (10/21/2019), COPD, Parkinson's disease.   Chest pain: No ischemia on SPECT MPI 07/2021 Continue amlodipine 5 mg daily. Continue plavix, Lipitor to 40 mg daily.   HFpEF: Chronic. Stable.  Hypertension: Not well controlled.  F/u in 6 months  Chayce Robbins Esther Hardy, MD [facility] Pager: [phone] Office: [phone]

## 2021-08-11 ENCOUNTER — Telehealth: Payer: Self-pay

## 2021-08-11 NOTE — Telephone Encounter (Signed)
error 

## 2021-11-05 ENCOUNTER — Ambulatory Visit: Payer: Medicare Other | Admitting: Cardiology

## 2021-11-12 ENCOUNTER — Ambulatory Visit: Payer: Medicare Other | Admitting: Cardiology

## 2021-11-24 ENCOUNTER — Encounter: Payer: Self-pay | Admitting: Cardiology

## 2021-11-24 ENCOUNTER — Other Ambulatory Visit: Payer: Self-pay

## 2021-11-24 ENCOUNTER — Ambulatory Visit: Payer: Medicare Other | Admitting: Cardiology

## 2021-11-24 VITALS — BP 137/72 | HR 58 | Temp 98.0°F | Resp 16 | Ht 62.0 in | Wt 193.0 lb

## 2021-11-24 DIAGNOSIS — I10 Essential (primary) hypertension: Secondary | ICD-10-CM

## 2021-11-24 DIAGNOSIS — E782 Mixed hyperlipidemia: Secondary | ICD-10-CM

## 2021-11-24 DIAGNOSIS — I208 Other forms of angina pectoris: Secondary | ICD-10-CM

## 2021-11-24 NOTE — Progress Notes (Signed)
Follow up visit  Subjective:   [name] [name], female    DOB: February 25, 1951, 71 y.o.   MRN: [id number]   HPI   Chief Complaint  Patient presents with   Chest Pain   Hypertension   Follow-up    3 months    71 y.o. Caucasian female with hypertension, hyperlipidemia, h/o recurrent stroke, HFpEF, abnormal stress test (10/21/2019), COPD, Parkinson's disease.   Patient is here today with her husband.  She had 1 episode 2 to 3 months ago of chest pain.  She was previously in the emergency room ACS was excluded.  She has not had any recurrence of chest pain since then.  Overall doing well.  Blood pressures well controlled.  Reviewed recent lab results with the patient, details below.  Initial consultation HPI 10/2019: Patient was admitted to [facility] in 10/2019 with chest pain or shortness of breath. Presentation was thought to be that of hypertensive urgency and acute on chronic HFpEF. However, given mildly elevated HS trop, she underwent nuclear stress test that showed  Moderate-sized fixed defect mid ventricle to apex with questionable region of peri-infarct ischemia at the anterior septal margin. Inn absence of recurrent chest pain, patient was treated medically with diuresis. She was started on losartan given uncontrolled blood pressure and acute on chronic HFpEF. Since discharge, she has not had any recurrent chest pain or shortness of breath symptoms. She is compliant with low salt diet and medications.   Patient has had only occasional episode of chest pain requiring nitroglycerin, roughly once a month.  She has episodes of exertional dyspnea with more than usual physical activity.  She and husband admit that her walking is limited at baseline.  She complains of left arm and leg numbness as well as difficulty grasping objects with her left hand.  On further questioning, she endorses pain and stiffness in her fingers, worse in the morning.  Blood pressure elevated today.  At home,  blood pressure ranges anywhere from 100/50 mmHg to 150/80 mmHg.  Recently, her PCP increased her labetalol dose.  Current Outpatient Medications on File Prior to Visit  Medication Sig Dispense Refill   albuterol (PROVENTIL HFA;VENTOLIN HFA) 108 (90 BASE) MCG/ACT inhaler Inhale 2 puffs into the lungs every 6 (six) hours as needed for wheezing.     ALPRAZolam (XANAX) 0.5 MG tablet Take 0.5 mg by mouth at bedtime as needed for sleep.      amLODipine (NORVASC) 5 MG tablet Take 1 tablet (5 mg total) by mouth daily. 90 tablet 3   Ascorbic Acid (VITA-C PO) Take 1,000 mg by mouth daily.     atorvastatin (LIPITOR) 40 MG tablet Take 0.5 tablets (20 mg total) by mouth daily. 30 tablet 3   clopidogrel (PLAVIX) 75 MG tablet Take 75 mg by mouth daily.     cyanocobalamin (,VITAMIN B-12,) 1000 MCG/ML injection Inject 1 mL into the muscle every 30 (thirty) days.     EPIPEN 2-PAK 0.3 MG/0.3ML SOAJ injection Inject 0.3 mg into the muscle as needed for anaphylaxis.      ferrous sulfate 325 (65 FE) MG tablet Take 325 mg by mouth daily with breakfast.     fluticasone furoate-vilanterol (BREO ELLIPTA) 100-25 MCG/INH AEPB Inhale 1 puff into the lungs daily.     gabapentin (NEURONTIN) 300 MG capsule Take 300-600 mg by mouth 2 (two) times daily. Take 331m in the morning and noon; then take 6078mat bedtime     isosorbide mononitrate (IMDUR) 30 MG 24  hr tablet Take 60 mg by mouth daily.      labetalol (NORMODYNE) 200 MG tablet Take 100 mg by mouth 2 (two) times daily. Dose reduced per husband      levothyroxine (SYNTHROID) 50 MCG tablet Take 50 mcg by mouth daily before breakfast.     losartan (COZAAR) 50 MG tablet Take 1 tablet (50 mg total) by mouth daily. (Patient taking differently: Take 50 mg by mouth daily. Taking in the AM) 30 tablet 0   nitroGLYCERIN (NITROSTAT) 0.4 MG SL tablet Place 1 tablet (0.4 mg total) under the tongue every 5 (five) minutes as needed for chest pain. 30 tablet 3   ondansetron (ZOFRAN-ODT) 4  MG disintegrating tablet Take 4 mg by mouth every 8 (eight) hours as needed for nausea.     pantoprazole (PROTONIX) 40 MG tablet Take 40 mg by mouth daily.     primidone (MYSOLINE) 50 MG tablet Take 150 mg by mouth in the morning and at bedtime.     PROCTOZONE-HC 2.5 % rectal cream Place 1 application rectally 2 (two) times daily as needed for hemorrhoids.     sertraline (ZOLOFT) 100 MG tablet Take 1 tablet (100 mg total) by mouth daily. 30 tablet 1   tolterodine (DETROL LA) 4 MG 24 hr capsule Take 4 mg by mouth daily.     topiramate (TOPAMAX) 50 MG tablet Take 50 mg by mouth 2 (two) times daily.     torsemide (DEMADEX) 20 MG tablet Take 1 tablet (20 mg total) by mouth daily. Take extra dose in the evening if weight gain more than 3 pounds or short of breath 30 tablet 0   traMADol (ULTRAM) 50 MG tablet Take 50 mg by mouth every 6 (six) hours as needed for moderate pain.      Vitamin D, Ergocalciferol, (DRISDOL) 1.25 MG (50000 UT) CAPS capsule Take 50,000 Units by mouth once a week.     No current facility-administered medications on file prior to visit.    Cardiovascular & other pertient studies:  Lexiscan Tetrofosmin stress test 07/28/2021: Lexiscan nuclear stress test performed using 1-day protocol.? Normal myocardial perfusion. Stress LVEF 54%.? Low risk study.?   EKG 07/01/2021: Sinus rhythm 60 bpm  Low voltage, otherwise normal EKG  Echocardiogram 10/19/2019: 1. Left ventricular ejection fraction, by visual estimation, is 55 to 60%. The left ventricle has normal function. There is mildly increased left ventricular hypertrophy.  2. Left ventricular diastolic parameters are consistent with Grade I diastolic dysfunction (impaired relaxation).  3. The left ventricle has no regional wall motion abnormalities.  4. Global right ventricle has normal systolic function.The right ventricular size is normal. No increase in right ventricular wall thickness.  5. Left atrial size was mildly  dilated.  6. Right atrial size was normal.  7. Moderate pericardial effusion.  8. Presence of pericardial fat pad.  9. The pericardial effusion is circumferential.  10. Mild mitral annular calcification.  11. The mitral valve is degenerative. No evidence of mitral valve regurgitation.  12. The tricuspid valve is grossly normal. Tricuspid valve regurgitation is trivial.  13. The aortic valve is tricuspid. Aortic valve regurgitation is not visualized. No evidence of aortic valve sclerosis or stenosis.  14. The pulmonic valve was grossly normal. Pulmonic valve regurgitation is not visualized.  15. TR signal is inadequate for assessing pulmonary artery systolic pressure.  Nuclear stress test 10/20/2019: 1. Moderate-sized fixed defect mid ventricle to apex with questionable region of peri-infarct ischemia at the anterior septalmargin. 2. Normal left  ventricular wall motion. 3. Left ventricular ejection fraction 54% 4. Non invasive risk stratification*: Low   Recent labs: 09/2021-11/2021: Glucose 103, BUN/Cr 7/0.9. EGFR 69. Na/K 142/3.8.  H/H 12/36. MCV 90. Platelets 169  05/07/2021: Glucose 97, BUN/Cr 13/1.03. EGFR 59. Na/K 142/3.6.  H/H 11/37. MCV 92. Platelets 192  10/19/2020:  Troponin I 33.  10/18/2020: BNP 552.4, Troponin I 65.  11/06/2019: Chol 131, TG 122, HDL 40, LDL 69.  10/22/2019: Glucose 98, BUN/Cr 12/1.08. EGFR 53. Na/K 140/3.7. Rest of the CMP normal H/H 11.8/36.2. MCV 84. Platelets 200   Review of Systems  Cardiovascular:  Negative for chest pain, dyspnea on exertion, leg swelling, palpitations and syncope.  Musculoskeletal:  Positive for joint pain (Left hand).  Neurological:  Positive for paresthesias (Left arm and left leg).        Vitals:   11/24/21 1301  BP: 137/72  Pulse: (!) 58  Resp: 16  Temp: 98 F (36.7 C)  SpO2: 95%     Body mass index is 35.3 kg/m. Filed Weights   11/24/21 1301  Weight: 193 lb (87.5 kg)     Objective:   Physical  Exam Vitals and nursing note reviewed.  Constitutional:      General: She is not in acute distress. Neck:     Vascular: No JVD.  Cardiovascular:     Rate and Rhythm: Normal rate and regular rhythm.     Pulses: Intact distal pulses.     Heart sounds: Normal heart sounds. No murmur heard. Pulmonary:     Effort: Pulmonary effort is normal.     Breath sounds: Normal breath sounds. No wheezing or rales.  Musculoskeletal:     Right lower leg: No edema.     Left lower leg: No edema.     Comments: Bony deformity left hand          Assessment & Recommendations:   71 y.o. Caucasian female with hypertension, hyperlipidemia, h/o recurrent stroke, HFpEF, abnormal stress test (10/21/2019), COPD, Parkinson's disease.   Chest pain: Occasional.  No ischemia on SPECT MPI 07/2021 Continue amlodipine 5 mg daily. Continue plavix, Lipitor to 40 mg daily.   HFpEF: Chronic. Stable.   Hypertension: Well controlled.  F/u in 6 months  Mireyah Chervenak Esther Hardy, MD [facility] Pager: [phone] Office: [phone]

## 2021-11-25 ENCOUNTER — Encounter: Payer: Self-pay | Admitting: Cardiology

## 2022-03-18 IMAGING — MG DIGITAL SCREENING BILAT W/ TOMO W/ CAD
6 of 12 series · 6 of 36 positions shown · non-contrast
Comparison: Previous exam(s).

CLINICAL DATA: Screening.

EXAM:
DIGITAL SCREENING BILATERAL MAMMOGRAM WITH TOMO AND CAD

[L MLO synth-2D]
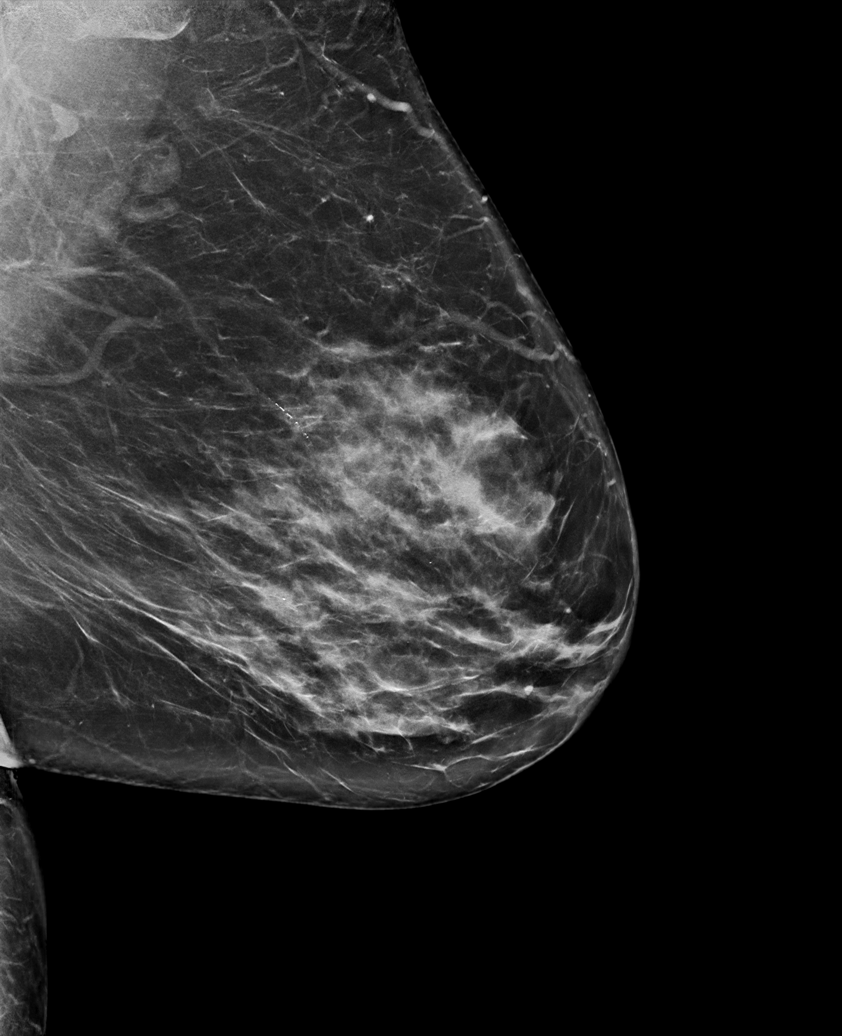

[R XCCL synth-2D]
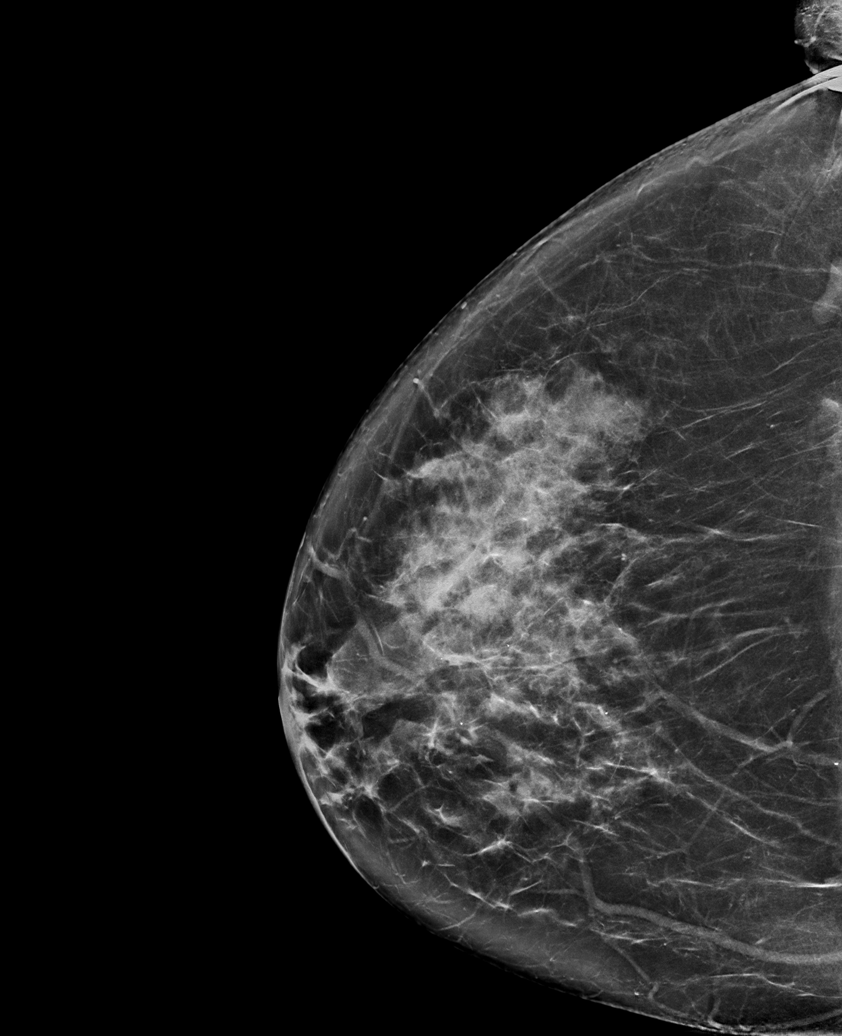

[R MLO synth-2D]
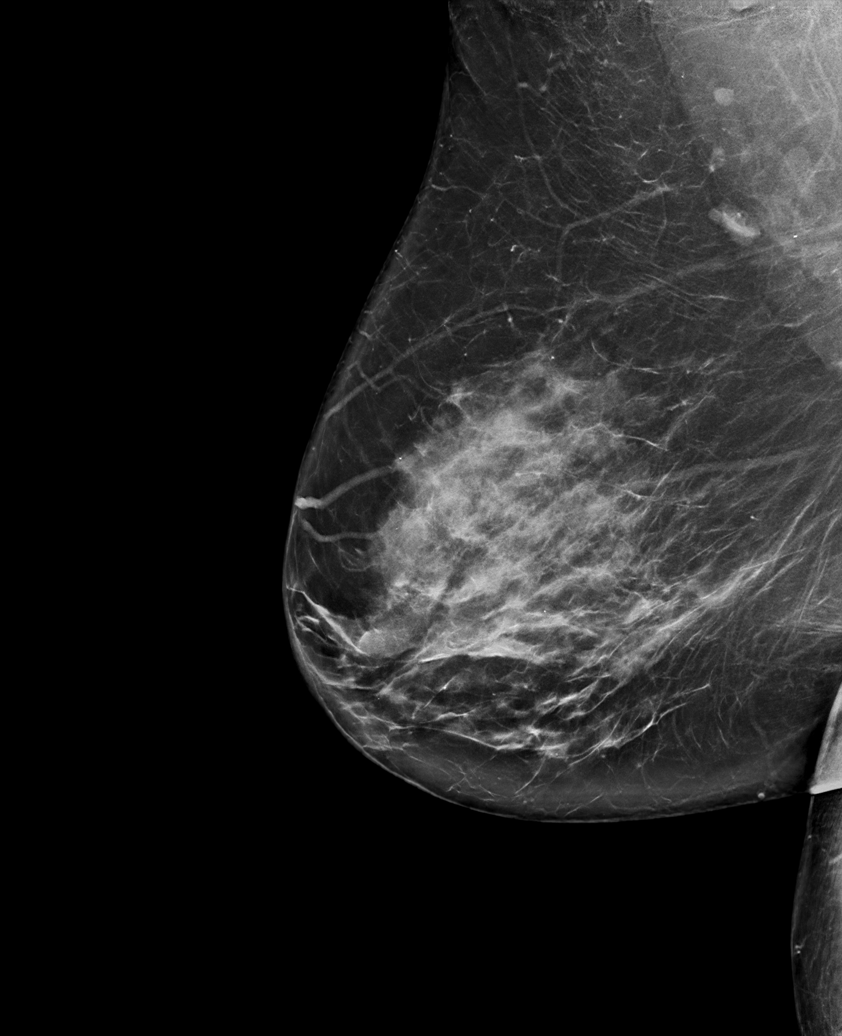

[R CC synth-2D]
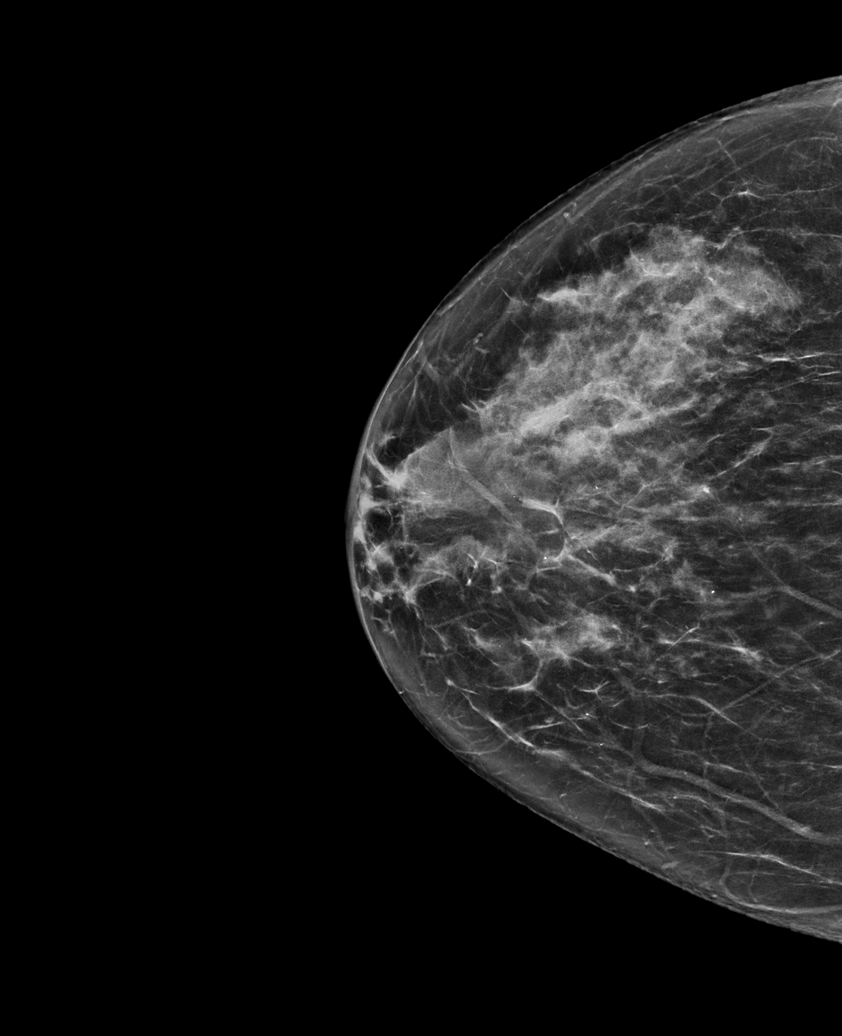

[L XCCL synth-2D]
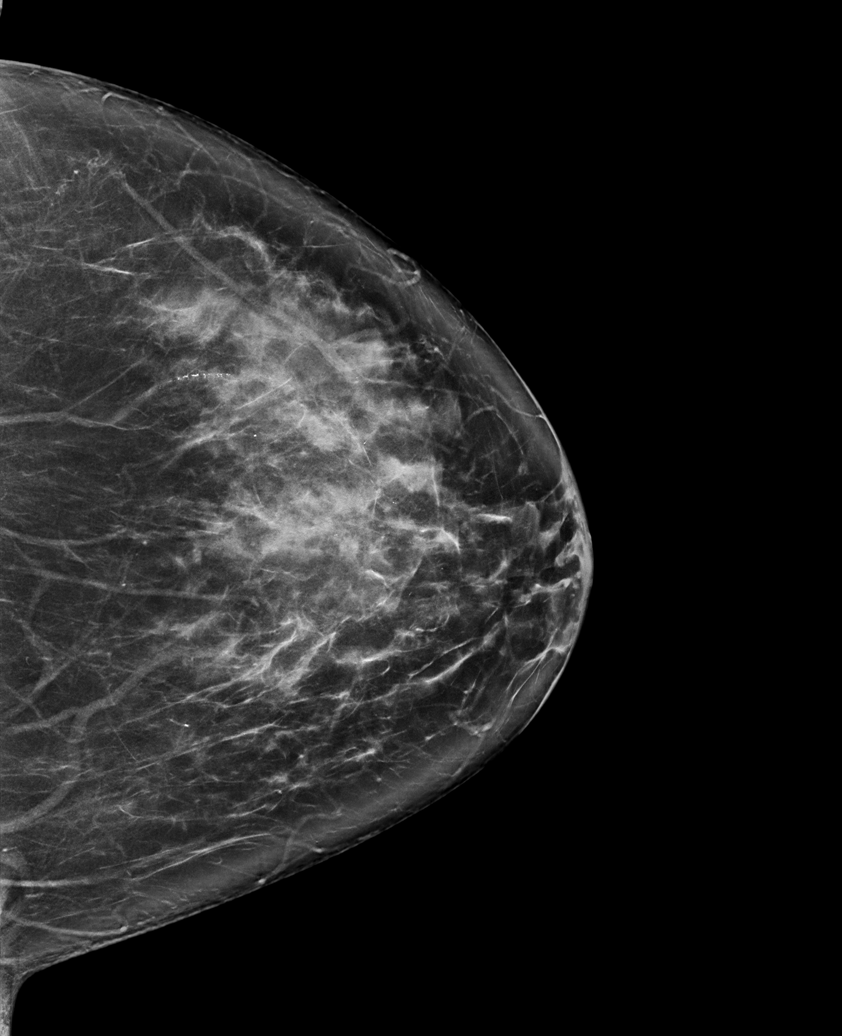

[L CC synth-2D]
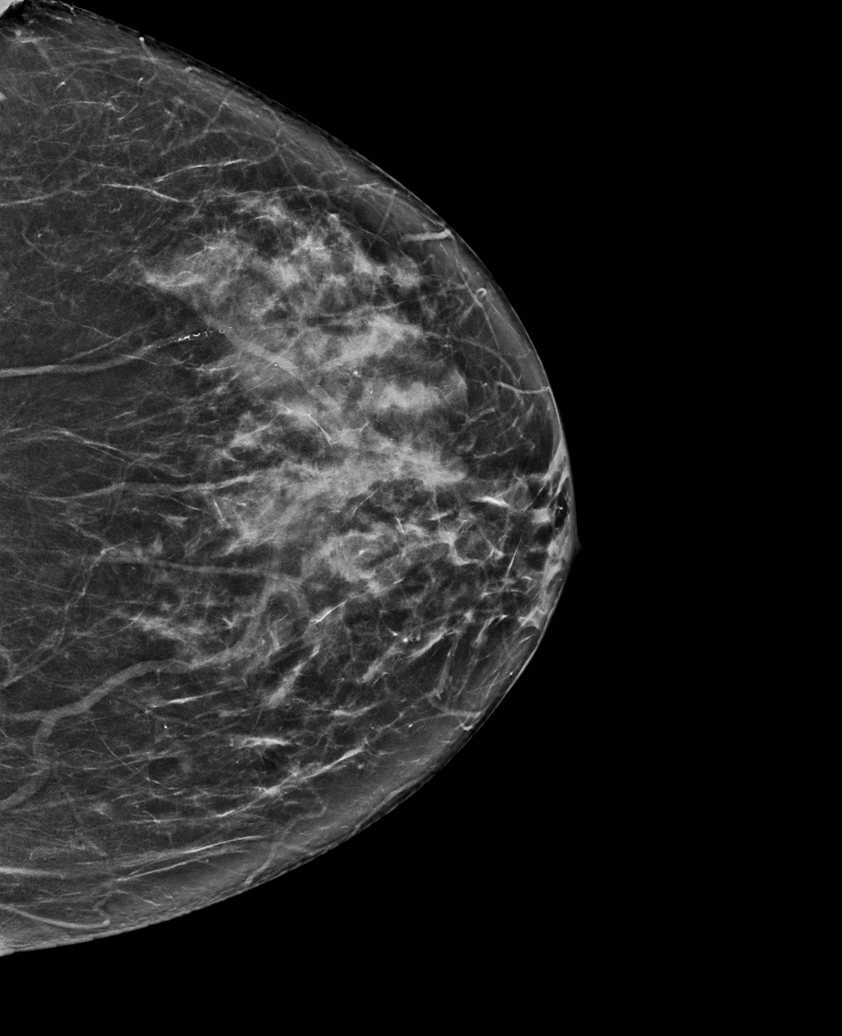

[6 of 36 positions shown; findings below may reference images not displayed]

ACR Breast Density Category c: The breast tissue is heterogeneously
dense, which may obscure small masses.
FINDINGS: There are no findings suspicious for malignancy. Images were
processed with CAD.
IMPRESSION: No mammographic evidence of malignancy. A result letter of this
screening mammogram will be mailed directly to the patient.

RECOMMENDATION:
Screening mammogram in one year. (Code:[id number])

BI-RADS CATEGORY  1: Negative.

## 2022-05-16 ENCOUNTER — Ambulatory Visit: Payer: Medicare Other | Admitting: Cardiology

## 2022-06-13 ENCOUNTER — Ambulatory Visit: Payer: Medicare Other | Admitting: Cardiology

## 2022-07-08 ENCOUNTER — Ambulatory Visit: Payer: Medicare Other | Admitting: Cardiology

## 2022-07-21 ENCOUNTER — Ambulatory Visit: Payer: Medicare Other | Admitting: Cardiology

## 2022-07-21 ENCOUNTER — Encounter: Payer: Self-pay | Admitting: Cardiology

## 2022-07-21 VITALS — BP 142/75 | HR 57 | Temp 98.0°F | Resp 16 | Ht 62.0 in | Wt 185.0 lb

## 2022-07-21 DIAGNOSIS — I208 Other forms of angina pectoris: Secondary | ICD-10-CM

## 2022-07-21 DIAGNOSIS — I5032 Chronic diastolic (congestive) heart failure: Secondary | ICD-10-CM

## 2022-07-21 NOTE — Progress Notes (Signed)
 Follow up visit  Subjective:   [name] [name], female    DOB: 04/16/1951, 71 y.o.   MRN: [id number]   HPI   Chief Complaint  Patient presents with   Chest Pain   Follow-up    6 month    71 y.o. Caucasian female with hypertension, hyperlipidemia, h/o recurrent stroke, HFpEF, abnormal stress test (10/21/2019), COPD, Parkinson's disease.   Patient is doing well, denies chest pain, has only occasional dyspnea on exertion. Blood pressure generally well controlled.   Initial consultation HPI 10/2019: Patient was admitted to [facility] in 10/2019 with chest pain or shortness of breath. Presentation was thought to be that of hypertensive urgency and acute on chronic HFpEF. However, given mildly elevated HS trop, she underwent nuclear stress test that showed  Moderate-sized fixed defect mid ventricle to apex with questionable region of peri-infarct ischemia at the anterior septal margin. Inn absence of recurrent chest pain, patient was treated medically with diuresis. She was started on losartan given uncontrolled blood pressure and acute on chronic HFpEF. Since discharge, she has not had any recurrent chest pain or shortness of breath symptoms. She is compliant with low salt diet and medications.   Patient has had only occasional episode of chest pain requiring nitroglycerin, roughly once a month.  She has episodes of exertional dyspnea with more than usual physical activity.  She and husband admit that her walking is limited at baseline.  She complains of left arm and leg numbness as well as difficulty grasping objects with her left hand.  On further questioning, she endorses pain and stiffness in her fingers, worse in the morning.  Blood pressure elevated today.  At home, blood pressure ranges anywhere from 100/50 mmHg to 150/80 mmHg.  Recently, her PCP increased her labetalol dose.   Current Outpatient Medications:    albuterol (PROVENTIL HFA;VENTOLIN HFA) 108 (90 BASE) MCG/ACT  inhaler, Inhale 2 puffs into the lungs every 6 (six) hours as needed for wheezing., Disp: , Rfl:    ALPRAZolam (XANAX) 0.5 MG tablet, Take 0.5 mg by mouth at bedtime as needed for sleep. , Disp: , Rfl:    amLODipine (NORVASC) 5 MG tablet, Take 1 tablet (5 mg total) by mouth daily., Disp: 90 tablet, Rfl: 3   Ascorbic Acid (VITA-C PO), Take 1,000 mg by mouth daily., Disp: , Rfl:    atorvastatin (LIPITOR) 40 MG tablet, Take 0.5 tablets (20 mg total) by mouth daily., Disp: 30 tablet, Rfl: 3   clopidogrel (PLAVIX) 75 MG tablet, Take 75 mg by mouth daily., Disp: , Rfl:    cyanocobalamin (,VITAMIN B-12,) 1000 MCG/ML injection, Inject 1 mL into the muscle every 30 (thirty) days., Disp: , Rfl:    EPIPEN 2-PAK 0.3 MG/0.3ML SOAJ injection, Inject 0.3 mg into the muscle as needed for anaphylaxis. , Disp: , Rfl:    ferrous sulfate 325 (65 FE) MG tablet, Take 325 mg by mouth daily with breakfast., Disp: , Rfl:    fluticasone furoate-vilanterol (BREO ELLIPTA) 100-25 MCG/INH AEPB, Inhale 1 puff into the lungs daily., Disp: , Rfl:    gabapentin (NEURONTIN) 300 MG capsule, Take 300-600 mg by mouth 2 (two) times daily. Take 300mg in the morning and noon; then take 600mg at bedtime, Disp: , Rfl:    isosorbide mononitrate (IMDUR) 60 MG 24 hr tablet, Take 60 mg by mouth daily. , Disp: , Rfl:    labetalol (NORMODYNE) 200 MG tablet, Take 100 mg by mouth 2 (two) times daily. Dose reduced per   husband , Disp: , Rfl:    levothyroxine (SYNTHROID) 50 MCG tablet, Take 50 mcg by mouth daily before breakfast., Disp: , Rfl:    losartan (COZAAR) 50 MG tablet, Take 1 tablet (50 mg total) by mouth daily. (Patient taking differently: Take 50 mg by mouth daily. Taking in the AM), Disp: 30 tablet, Rfl: 0   nitroGLYCERIN (NITROSTAT) 0.4 MG SL tablet, Place 1 tablet (0.4 mg total) under the tongue every 5 (five) minutes as needed for chest pain., Disp: 30 tablet, Rfl: 3   ondansetron (ZOFRAN-ODT) 4 MG disintegrating tablet, Take 4 mg by  mouth every 8 (eight) hours as needed for nausea., Disp: , Rfl:    pantoprazole (PROTONIX) 40 MG tablet, Take 40 mg by mouth daily., Disp: , Rfl:    primidone (MYSOLINE) 50 MG tablet, Take 50 mg by mouth in the morning and at bedtime., Disp: , Rfl:    PROCTOZONE-HC 2.5 % rectal cream, Place 1 application rectally 2 (two) times daily as needed for hemorrhoids., Disp: , Rfl:    sertraline (ZOLOFT) 100 MG tablet, Take 1 tablet (100 mg total) by mouth daily., Disp: 30 tablet, Rfl: 1   topiramate (TOPAMAX) 50 MG tablet, Take 50 mg by mouth 2 (two) times daily., Disp: , Rfl:    torsemide (DEMADEX) 20 MG tablet, Take 1 tablet (20 mg total) by mouth daily. Take extra dose in the evening if weight gain more than 3 pounds or short of breath, Disp: 30 tablet, Rfl: 0   traMADol (ULTRAM) 50 MG tablet, Take 50 mg by mouth every 6 (six) hours as needed for moderate pain. , Disp: , Rfl:    Vitamin D, Ergocalciferol, (DRISDOL) 1.25 MG (50000 UT) CAPS capsule, Take 50,000 Units by mouth once a week., Disp: , Rfl:   Cardiovascular & other pertient studies:  EKG 07/21/2022: Sinus bradycardia 54 bpm RBBB  Lexiscan Tetrofosmin stress test 07/28/2021: Lexiscan nuclear stress test performed using 1-day protocol.? Normal myocardial perfusion. Stress LVEF 54%.? Low risk study.?   Echocardiogram 10/19/2019: 1. Left ventricular ejection fraction, by visual estimation, is 55 to 60%. The left ventricle has normal function. There is mildly increased left ventricular hypertrophy.  2. Left ventricular diastolic parameters are consistent with Grade I diastolic dysfunction (impaired relaxation).  3. The left ventricle has no regional wall motion abnormalities.  4. Global right ventricle has normal systolic function.The right ventricular size is normal. No increase in right ventricular wall thickness.  5. Left atrial size was mildly dilated.  6. Right atrial size was normal.  7. Moderate pericardial effusion.  8. Presence of  pericardial fat pad.  9. The pericardial effusion is circumferential.  10. Mild mitral annular calcification.  11. The mitral valve is degenerative. No evidence of mitral valve regurgitation.  12. The tricuspid valve is grossly normal. Tricuspid valve regurgitation is trivial.  13. The aortic valve is tricuspid. Aortic valve regurgitation is not visualized. No evidence of aortic valve sclerosis or stenosis.  14. The pulmonic valve was grossly normal. Pulmonic valve regurgitation is not visualized.  15. TR signal is inadequate for assessing pulmonary artery systolic pressure.   Recent labs: 09/2021-11/2021: Glucose 103, BUN/Cr 7/0.9. EGFR 69. Na/K 142/3.8.  H/H 12/36. MCV 90. Platelets 169  11/06/2019: Chol 131, TG 122, HDL 40, LDL 69.   Review of Systems  Cardiovascular:  Negative for chest pain, dyspnea on exertion, leg swelling, palpitations and syncope.  Musculoskeletal:  Positive for joint pain (Left hand).  Neurological:  Positive for paresthesias (Left  arm and left leg).         Vitals:   07/21/22 0954  BP: (!) 142/75  Pulse: (!) 57  Resp: 16  Temp: 98 F (36.7 C)  SpO2: 96%      There is no height or weight on file to calculate BMI. Filed Weights   07/21/22 0954  Weight: 185 lb (83.9 kg)     Objective:   Physical Exam Vitals and nursing note reviewed.  Constitutional:      General: She is not in acute distress. Neck:     Vascular: No JVD.  Cardiovascular:     Rate and Rhythm: Normal rate and regular rhythm.     Pulses: Intact distal pulses.     Heart sounds: Normal heart sounds. No murmur heard. Pulmonary:     Effort: Pulmonary effort is normal.     Breath sounds: Normal breath sounds. No wheezing or rales.  Musculoskeletal:     Right lower leg: No edema.     Left lower leg: No edema.     Comments: Bony deformity left hand          Assessment & Recommendations:   71 y.o. Caucasian female with hypertension, hyperlipidemia, h/o recurrent  stroke, HFpEF, abnormal stress test (10/21/2019), COPD, Parkinson's disease.   Chest pain: No recurrence. No ischemia on SPECT MPI 07/2021 Continue amlodipine 5 mg daily. Continue plavix, Lipitor to 40 mg daily.   HFpEF: Chronic. Stable.  Hypertension: Well controlled.  F/u in 1 year  Nigel Mormon, MD [facility] Pager: [phone] Office: [phone]

## 2022-09-05 ENCOUNTER — Other Ambulatory Visit: Payer: Self-pay | Admitting: Cardiology

## 2022-09-05 DIAGNOSIS — E782 Mixed hyperlipidemia: Secondary | ICD-10-CM

## 2023-06-24 ENCOUNTER — Other Ambulatory Visit: Payer: Self-pay | Admitting: Cardiology

## 2023-06-24 DIAGNOSIS — I2089 Other forms of angina pectoris: Secondary | ICD-10-CM

## 2023-07-20 ENCOUNTER — Ambulatory Visit: Payer: Medicare Other | Admitting: Cardiology

## 2023-08-08 ENCOUNTER — Ambulatory Visit: Payer: Medicare Other | Admitting: Cardiology

## 2023-09-26 ENCOUNTER — Other Ambulatory Visit: Payer: Self-pay | Admitting: Cardiology

## 2023-09-26 DIAGNOSIS — E782 Mixed hyperlipidemia: Secondary | ICD-10-CM

## 2023-10-19 ENCOUNTER — Ambulatory Visit: Payer: Medicare Other | Admitting: Cardiology

## 2023-10-19 NOTE — Progress Notes (Unsigned)
  Cardiology Office Note:  .   Date:  10/19/2023  ID:  [name], DOB 12/04/1950, MRN [id number] PCP: Mechele Claude  [facility] Providers Cardiologist:  Truett Mainland, MD PCP: Etta Quill, PA-C  No chief complaint on file.     History of Present Illness: .    [name] [name] is a 72 y.o. female with  hypertension, hyperlipidemia, h/o recurrent stroke, HFpEF, abnormal stress test (10/21/2019), COPD, Parkinson's disease.     There were no vitals filed for this visit.   ROS: *** ROS   Studies Reviewed: .        *** Independently interpreted ***/202***: Chol ***, TG ***, HDL ***, LDL *** HbA1C ***% Hb *** Cr *** ***  Risk Assessment/Calculations:   {Does this patient have ATRIAL FIBRILLATION?:[phone]}     Physical Exam:   Physical Exam   VISIT DIAGNOSES: No diagnosis found.   ASSESSMENT AND PLAN: .    [name] [name] is a 72 y.o. female with hypertension, hyperlipidemia, h/o recurrent stroke, HFpEF, abnormal stress test (10/21/2019), COPD, Parkinson's disease.    *** Chest pain: No recurrence. No ischemia on SPECT MPI 07/2021 Continue amlodipine 5 mg daily. Continue plavix, Lipitor to 40 mg daily.    *** HFpEF: Chronic. Stable.   *** Hypertension: Well controlled.    {Are you ordering a CV Procedure (e.g. stress test, cath, DCCV, TEE, etc)?   Press F2        :[id number]}    No orders of the defined types were placed in this encounter.    F/u in ***  Signed, Elder Negus, MD

## 2023-11-14 ENCOUNTER — Ambulatory Visit: Payer: Medicare Other | Attending: Cardiology | Admitting: Cardiology

## 2023-11-14 NOTE — Progress Notes (Deleted)
  Cardiology Office Note:  .   Date:  11/14/2023  ID:  [name] [name], DOB [birth year], MRN [id number] PCP: Dorise Toribio JINNY DEVONNA   [facility] Providers Cardiologist:  Newman Lawrence, MD PCP: Dorise Toribio JINNY, PA-C  No chief complaint on file.     History of Present Illness: .    [name] [name] is a 73 y.o. female with hypertension, hyperlipidemia, h/o recurrent stroke, HFpEF, abnormal stress test (10/21/2019), COPD, Parkinson's disease.     There were no vitals filed for this visit.   ROS: *** ROS   Studies Reviewed: .        *** Independently interpreted ***/202***: Chol ***, TG ***, HDL ***, LDL *** HbA1C ***% Hb *** Cr *** ***  Risk Assessment/Calculations:   {Does this patient have ATRIAL FIBRILLATION?[phone]}     Physical Exam:   Physical Exam   VISIT DIAGNOSES: No diagnosis found.   ASSESSMENT AND PLAN: .    [name] [name] is a 73 y.o. female with hypertension, hyperlipidemia, h/o recurrent stroke, HFpEF, abnormal stress test (10/21/2019), COPD, Parkinson's disease.    *** Chest pain: No recurrence. No ischemia on SPECT MPI 07/2021 Continue amlodipine  5 mg daily. Continue plavix , Lipitor 40 mg daily.    *** HFpEF: Chronic. Stable.   *** Hypertension: Well controlled.  {Are you ordering a CV Procedure (e.g. stress test, cath, DCCV, TEE, etc)?   Press F2        :[id number]}    No orders of the defined types were placed in this encounter.    F/u in ***  Signed, Newman JINNY Lawrence, MD

## 2023-11-15 ENCOUNTER — Encounter: Payer: Self-pay | Admitting: Cardiology

## 2023-12-19 ENCOUNTER — Other Ambulatory Visit: Payer: Self-pay | Admitting: Cardiology

## 2023-12-19 DIAGNOSIS — E782 Mixed hyperlipidemia: Secondary | ICD-10-CM

## 2023-12-20 NOTE — Telephone Encounter (Signed)
This pt is passed her yearly followup and has an appt in March. As [name] [name] has either cancelled or No-Showed 4 appts recently, does Dr. Rosemary Holms want to refill? Please advise.

## 2024-01-19 ENCOUNTER — Ambulatory Visit: Payer: Medicare Other | Admitting: Cardiology

## 2024-02-07 ENCOUNTER — Other Ambulatory Visit: Payer: Self-pay | Admitting: Cardiology

## 2024-02-07 DIAGNOSIS — E782 Mixed hyperlipidemia: Secondary | ICD-10-CM

## 2024-02-07 NOTE — Telephone Encounter (Signed)
 Dr. Damian Leavell pt. Last seen in 07/2022. She has cancelled or no showed multiple appts. Does Dr. Rosemary Holms want to refill? Please advise

## 2024-02-08 NOTE — Telephone Encounter (Signed)
 Okay to refill.  Thanks MJP

## 2024-03-04 ENCOUNTER — Other Ambulatory Visit: Payer: Self-pay | Admitting: Family Medicine

## 2024-03-04 DIAGNOSIS — Z1239 Encounter for other screening for malignant neoplasm of breast: Secondary | ICD-10-CM

## 2024-03-14 ENCOUNTER — Other Ambulatory Visit: Payer: Self-pay | Admitting: Cardiology

## 2024-03-14 DIAGNOSIS — E782 Mixed hyperlipidemia: Secondary | ICD-10-CM

## 2024-03-19 ENCOUNTER — Ambulatory Visit: Admitting: Cardiology

## 2024-03-19 ENCOUNTER — Encounter: Payer: Self-pay | Admitting: Cardiology

## 2024-03-19 VITALS — BP 120/68 | HR 59 | Resp 16 | Ht 62.0 in | Wt 156.8 lb

## 2024-03-19 DIAGNOSIS — E782 Mixed hyperlipidemia: Secondary | ICD-10-CM

## 2024-03-19 DIAGNOSIS — I5032 Chronic diastolic (congestive) heart failure: Secondary | ICD-10-CM

## 2024-03-19 NOTE — Progress Notes (Signed)
  Cardiology Office Note:  .   Date:  03/19/2024  ID:  [name] [name], DOB July 05, 1951, MRN [id number] PCP: Nathen Balder  [facility] Providers Cardiologist:  Fransico Ivy, MD PCP: Cheron Corning, PA-C  Chief Complaint  Patient presents with   Chronic heart failure with preserved ejection fraction   Follow-up     [name] [name] is a 73 y.o. female with hypertension, hyperlipidemia, h/o recurrent stroke, HFpEF, abnormal stress test (10/21/2019), COPD, Parkinson's disease.   History of Present Illness  Patient is here with her husband today.  She denies any complaints of chest pain, shortness of breath.  She walks with a cane, has not had any recurrent falls.  She has regular follow-up with the PCP.     Vitals:   03/19/24 0934  BP: 120/68  Pulse: (!) 59  Resp: 16  SpO2: 92%      Review of Systems  Cardiovascular:  Negative for chest pain, dyspnea on exertion, leg swelling, palpitations and syncope.        Studies Reviewed: Aaron Aas        EKG 03/19/2024: Sinus rhythm with 1st degree A-V block Right axis deviation Cannot rule out Anterior infarct , age undetermined When compared with ECG of 08-May-2021 07:10, No significant change was found    Independently interpreted 01/2024: Hb 12.9 Cr 0.88    Physical Exam Vitals and nursing note reviewed.  Constitutional:      General: She is not in acute distress. Neck:     Vascular: No JVD.  Cardiovascular:     Rate and Rhythm: Normal rate and regular rhythm.     Heart sounds: Normal heart sounds. No murmur heard. Pulmonary:     Effort: Pulmonary effort is normal.     Breath sounds: Normal breath sounds. No wheezing or rales.  Musculoskeletal:     Right lower leg: No edema.     Left lower leg: No edema.      VISIT DIAGNOSES:   ICD-10-CM   1. Chronic heart failure with preserved ejection fraction (HCC)  I50.32 EKG 12-Lead    2. Mixed hyperlipidemia  E78.2 Lipid panel       [name] [name]  is a 73 y.o. female with hypertension, hyperlipidemia, h/o recurrent stroke, HFpEF, abnormal stress test (10/21/2019), COPD, Parkinson's disease.   Assessment & Plan   HFpEF: Euvolemic, no evidence of heart failure on exam.   Hypertension: Well controlled.  Mixed hyperlipidemia: Continue Lipitor.  Check lipid panel.  H/o stroke: Continue Plavix .  No bleeding issues.  Recommend continued follow-up with PCP.  I will see her as needed.     F/u as needed  Signed, Cody Das, MD

## 2024-03-19 NOTE — Patient Instructions (Signed)
 Lab Work: Lipid panel   If you have labs (blood work) drawn today and your tests are completely normal, you will receive your results only by: MyChart Message (if you have MyChart) OR A paper copy in the mail If you have any lab test that is abnormal or we need to change your treatment, we will call you to review the results.  Follow-Up: At [facility], you and your health needs are our priority.  As part of our continuing mission to provide you with exceptional heart care, our providers are all part of one team.  This team includes your primary Cardiologist (physician) and Advanced Practice Providers or APPs (Physician Assistants and Nurse Practitioners) who all work together to provide you with the care you need, when you need it.  Your next appointment:   As needed   Provider:   Cody Das, MD

## 2024-03-20 ENCOUNTER — Ambulatory Visit: Payer: Self-pay | Admitting: *Deleted

## 2024-03-20 DIAGNOSIS — E782 Mixed hyperlipidemia: Secondary | ICD-10-CM

## 2024-03-20 LAB — LIPID PANEL
Chol/HDL Ratio: 3.6 ratio (ref 0.0–4.4)
Cholesterol, Total: 167 mg/dL (ref 100–199)
HDL: 46 mg/dL (ref >39)
LDL Chol Calc (NIH): 96 mg/dL (ref 0–99)
Triglycerides: 141 mg/dL (ref 0–149)
VLDL Cholesterol Cal: 25 mg/dL (ref 5–40)

## 2024-03-20 NOTE — Progress Notes (Signed)
 Patient will be see me as needed LDL (bad cholesterol) is 96.  Previously, it was 69.  Goal is <70.  Recommend increasing Lipitor from 40 mg daily to 80 mg daily.  Please send 90-day prescription with 1 refill, and requests future refills to be taken over by PCP.  Recommend repeat lipid panel in 3 months.  This can be checked with us  or PCP.

## 2024-03-21 NOTE — Telephone Encounter (Signed)
 Pt returning call

## 2024-03-25 NOTE — Telephone Encounter (Signed)
Follow Up:      Patient is returning call from today. 

## 2024-03-28 ENCOUNTER — Ambulatory Visit (INDEPENDENT_AMBULATORY_CARE_PROVIDER_SITE_OTHER): Payer: Self-pay

## 2024-03-28 DIAGNOSIS — Z1231 Encounter for screening mammogram for malignant neoplasm of breast: Secondary | ICD-10-CM | POA: Diagnosis not present

## 2024-03-28 DIAGNOSIS — Z1239 Encounter for other screening for malignant neoplasm of breast: Secondary | ICD-10-CM

## 2024-04-01 NOTE — Progress Notes (Signed)
 Recommend taking Lipitor 40 mg 1 tab daily. Can f/u lipid panel w/PCP.  Thanks MJP

## 2024-04-02 MED ORDER — ATORVASTATIN CALCIUM 40 MG PO TABS: 40.0000 mg | ORAL_TABLET | Freq: Every day | ORAL | 1 refills | Status: AC
